# Patient Record
Sex: Male | Born: 1940 | Race: White | Hispanic: No | Marital: Married | Smoking: Former smoker
Health system: Southern US, Community
[De-identification: ages and names within clinical notes are randomized; demographics above are authoritative.]

## PROBLEM LIST (undated history)

## (undated) DIAGNOSIS — I10 Essential (primary) hypertension: Secondary | ICD-10-CM

## (undated) HISTORY — PX: COLONOSCOPY: SHX174

## (undated) HISTORY — PX: TOTAL HIP ARTHROPLASTY: SHX124

## (undated) HISTORY — DX: Essential (primary) hypertension: I10

---

## 2006-01-12 ENCOUNTER — Ambulatory Visit (HOSPITAL_COMMUNITY): Admission: RE | Admit: 2006-01-12 | Discharge: 2006-01-12 | Payer: Self-pay | Admitting: General Surgery

## 2007-05-09 ENCOUNTER — Encounter: Payer: Self-pay | Admitting: Orthopedic Surgery

## 2007-10-27 ENCOUNTER — Ambulatory Visit: Payer: Self-pay | Admitting: Orthopedic Surgery

## 2007-10-27 DIAGNOSIS — M549 Dorsalgia, unspecified: Secondary | ICD-10-CM | POA: Insufficient documentation

## 2007-10-27 DIAGNOSIS — M25559 Pain in unspecified hip: Secondary | ICD-10-CM | POA: Insufficient documentation

## 2007-10-27 DIAGNOSIS — M8700 Idiopathic aseptic necrosis of unspecified bone: Secondary | ICD-10-CM | POA: Insufficient documentation

## 2007-10-27 DIAGNOSIS — IMO0002 Reserved for concepts with insufficient information to code with codable children: Secondary | ICD-10-CM

## 2007-11-02 ENCOUNTER — Ambulatory Visit (HOSPITAL_COMMUNITY): Admission: RE | Admit: 2007-11-02 | Discharge: 2007-11-02 | Payer: Self-pay | Admitting: Orthopedic Surgery

## 2007-11-28 ENCOUNTER — Ambulatory Visit (HOSPITAL_COMMUNITY): Admission: RE | Admit: 2007-11-28 | Discharge: 2007-11-28 | Payer: Self-pay | Admitting: General Surgery

## 2009-02-23 IMAGING — CT CT PELVIS W/O CM
1 of 9 series · 3 of 14 positions shown, 4 images · IV contrast (agent unspecified)
Comparison: None.

CLINICAL DATA: 66-year-old male, with chronic low back pain radiating to both hips.  Evaluate herniated disc or avascular necrosis.  
 PELVIS CT WITHOUT CONTRAST:
TECHNIQUE: Multidetector CT imaging of the pelvis was performed following the standard protocol without IV contrast.
TECHNIQUE: Multidetector CT imaging of the lumbar spine was performed.  Multiplanar   CT image reconstructions were also generated.

[Series 3: lumbar spine 3.0 b30s · axial · 0.58mm/px · z∈[-472,-55]mm · 3 of 140 slices shown, 4 images]
[im 1/140  soft-tissue]
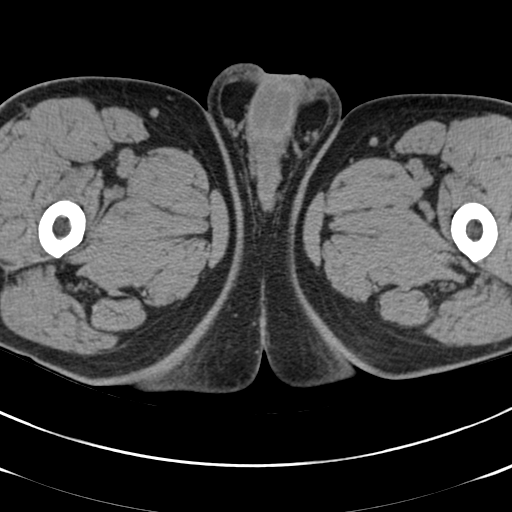
[im 1/140  bone]
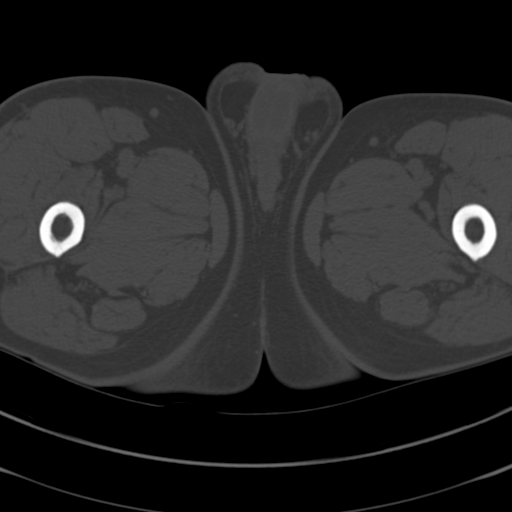
[im 70/140  bone]
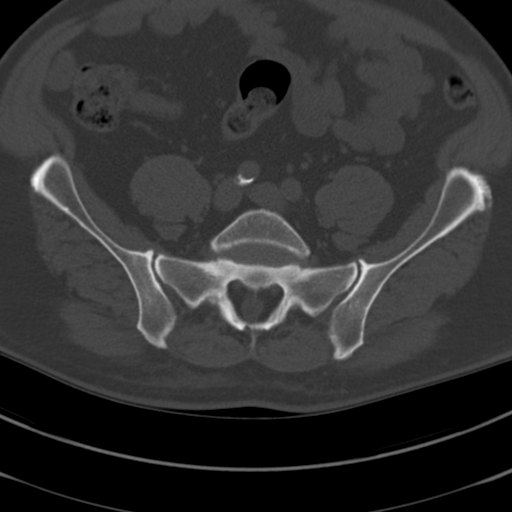
[im 140/140  bone]
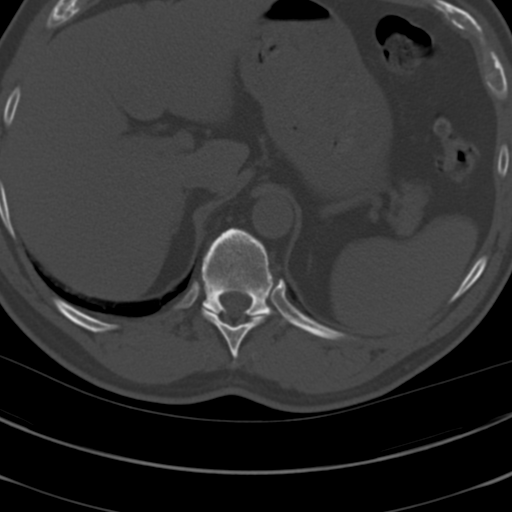

[3 of 14 positions shown; findings below may reference images not displayed]

FINDINGS: Calcified atherosclerotic disease involves the visualized distal abdominal aorta and continues into some segments of the iliac and bilateral femoral arteries bilaterally.  There are bilateral inguinal hernias, the left only contains an abundance of fat, the right contains a small segment of the bladder along with fat.  There is mild prostate enlargement and prostatic calcifications.  No free fluid in the pelvis.  Visualized distal large and small bowel loops are nondilated.  The appendix is normal.  There is diffuse sigmoid diverticulosis without evidence of active inflammation.  
 There is very severe bilateral degenerative arthropathy involving both hips, left perhaps slightly greater than right.  These show a complete loss of the superior joint spaces bilaterally with severe osteophytosis, subchondral sclerosis, and subchondral cystic formation.  The appearance is most compatible with very advanced osteoarthritis.  No acute fracture or dislocation is identified.  Elsewhere bone mineralization is within normal limits.  Mild subchondral cystic formation is also seen at the inferior aspects of the sacroiliac joints which also demonstrate some mild spurring.  Lumbar spine findings are discussed in the lumbar CT section below.
IMPRESSION: 1.  Very advanced bilateral hip joint osteoarthritis.  
 2.  Mild degenerative bilateral sacroiliac joint changes.
 3.  Sigmoid diverticulosis.  
 4.  Large predominately fat containing bilateral inguinal hernias.  A portion of the bladder has also herniated on the right. 
 LUMBAR SPINE CT WITHOUT CONTRAST:
FINDINGS: There is trace anterolisthesis of L-3 on L-4.  There is normal lumbar vertebral body height and bone mineralization.  Lumbar vertebral body alignment is otherwise normal.  Multilevel degenerative osseous changes are noted as follows:
 T11-T12:  Mild facet hypertrophy, no stenosis. 
 T12-L1:  Anterior disc bulge which will not cause neural compromise, otherwise normal. 
 L1-L2:  Mild to moderate facet spurring.  Slight disc bulge, no stenosis. 
 L2-L3:  Moderate bilateral facet spurring.  Bilateral ligament flavum hypertrophy.  Circumferential disc bulge.  Combined there is mild to moderate central spinal stenosis suspected (AP thecal sac approximately 6.6 mm).  Mild multifactorial bilateral neural foraminal stenosis. 
 L3-L4:  Very severe facet degeneration with spurring, hypertrophy, and bilateral vacuum facet phenomena, likely associated with the mild spondylolisthesis at this level.  Moderate to severe ligamentum flavum hypertrophy.  Moderate to severe left eccentric disc bulge.  Combined there is severe central spinal stenosis (AP thecal sac 4.9 mm).  There is moderate bilateral neural foraminal stenosis, multifactorial. 
 L4-L5:  Mild to moderate bilateral facet hypertrophy.  Mild circumferential disc bulge.  Mild central spinal stenosis suspected (AP thecal sac approximately 7.7 mm).  Mild multifactorial bilateral neural foraminal stenosis. 
 L5-S1:  No central spinal stenosis.  Moderate bilateral facet degeneration with spurring and subchondral cyst formation.  Mild bilateral multifactorial neural foraminal stenosis.
IMPRESSION: 1.  Severe spinal stenosis at L3-L4 related to advanced disc, facet, and ligamentum flavum degeneration.  Facet arthropathy at this level is particularly severe and there is trace anterolisthesis noted. 
 2.  Mild to moderate spinal stenosis at L2-L3 and mild spinal stenosis at L4-L5 as detailed above.
 3.  Multilevel multifactorial neural foraminal stenosis as detailed above.

## 2010-10-19 ENCOUNTER — Encounter: Payer: Self-pay | Admitting: Orthopedic Surgery

## 2011-02-10 NOTE — H&P (Signed)
NAME:  ROLLA, KEDZIERSKI NO.:  [id number]   MEDICAL RECORD NO.:  [id number]          PATIENT TYPE:  AMB   LOCATION:  DAY                           FACILITY:  [facility]   PHYSICIAN:  Dalia Heading, M.D.  DATE OF BIRTH:  December 01, 1940   DATE OF ADMISSION:  11/28/2007  DATE OF DISCHARGE:  LH                              HISTORY & PHYSICAL   CHIEF COMPLAINT:  Right inguinal hernia.   HISTORY OF PRESENT ILLNESS:  The patient is a 70 year old, white male  who is referred for evaluation and treatment of bilateral inguinal  hernias.  He has been seen by myself in the past and is becoming more  symptomatic on the right side.  It is made worse for straining.   PAST MEDICAL HISTORY:  Unremarkable.   PAST SURGICAL HISTORY:  Unremarkable.   CURRENT MEDICATIONS:  None.   ALLERGIES:  No known drug allergies.   REVIEW OF SYSTEMS:  Noncontributory.   PHYSICAL EXAMINATION:  GENERAL:  The patient is a well-developed, well-  nourished, white male in no acute distress.  LUNGS:  Clear to auscultation with equal breath sounds bilaterally.  HEART:  Regular rate and rhythm without S3, S4 or murmurs.  ABDOMEN:  Soft, nontender, nondistended.  No hepatosplenomegaly or  masses are noted.  A large reducible right inguinal hernia is present  with a smaller inguinal hernia on the left side.  GENITALIA:  Within normal limits.   IMPRESSION:  Symptomatic right inguinal hernia.   PLAN:  The patient is scheduled for a right inguinal herniorrhaphy on  November 28, 2007.  The risks and benefits of the procedure including  bleeding, infection, pain and recurrence of the hernia were fully  explained to the patient gaining informed consent.  I will delay repair  of the left inguinal hernia for the future.      Dalia Heading, M.D.  Electronically Signed     MAJ/MEDQ  D:  11/17/2007  T:  11/18/2007  Job:  [id number]   cc:   [facility]  Fax: [phone]   Kirstie Peri, MD  Fax: [phone]

## 2011-02-10 NOTE — H&P (Signed)
NAME:  Wesley Palmer, Wesley Palmer NO.:  [id number]   MEDICAL RECORD NO.:  [id number]          PATIENT TYPE:  AMB   LOCATION:  DAY                           FACILITY:  [facility]   PHYSICIAN:  Dalia Heading, M.D.  DATE OF BIRTH:  [birth year]   DATE OF ADMISSION:  11/28/2007  DATE OF DISCHARGE:  LH                              HISTORY & PHYSICAL   CHIEF COMPLAINT:  Right inguinal hernia.   HISTORY OF PRESENT ILLNESS:  The patient is a 70 year old, white male  who is referred for evaluation and treatment of bilateral inguinal  hernias.  He has been seen by myself in the past and is becoming more  symptomatic on the right side.  It is made worse for straining.   PAST MEDICAL HISTORY:  Unremarkable.   PAST SURGICAL HISTORY:  Unremarkable.   CURRENT MEDICATIONS:  None.   ALLERGIES:  No known drug allergies.   REVIEW OF SYSTEMS:  Noncontributory.   PHYSICAL EXAMINATION:  GENERAL:  The patient is a well-developed, well-  nourished, white male in no acute distress.  LUNGS:  Clear to auscultation with equal breath sounds bilaterally.  HEART:  Regular rate and rhythm without S3, S4 or murmurs.  ABDOMEN:  Soft, nontender, nondistended.  No hepatosplenomegaly or  masses are noted.  A large reducible right inguinal hernia is present  with a smaller inguinal hernia on the left side.  GENITALIA:  Within normal limits.   IMPRESSION:  Symptomatic right inguinal hernia.   PLAN:  The patient is scheduled for a right inguinal herniorrhaphy on  November 28, 2007.  The risks and benefits of the procedure including  bleeding, infection, pain and recurrence of the hernia were fully  explained to the patient gaining informed consent.  I will delay repair  of the left inguinal hernia for the future.      Dalia Heading, M.D.  Electronically Signed     MAJ/MEDQ  D:  11/17/2007  T:  11/18/2007  Job:  [id number]   cc:   [facility]  Fax: [phone]   Kirstie Peri, MD  Fax: [phone]

## 2011-02-10 NOTE — Op Note (Signed)
NAME:  Wesley Palmer, Wesley Palmer NO.:  [id number]   MEDICAL RECORD NO.:  [id number]          PATIENT TYPE:  AMB   LOCATION:  DAY                           FACILITY:  [facility]   PHYSICIAN:  Dalia Heading, M.D.  DATE OF BIRTH:  March 21, 1941   DATE OF PROCEDURE:  11/28/2007  DATE OF DISCHARGE:                               OPERATIVE REPORT   PREOPERATIVE DIAGNOSIS:  Right inguinal hernia.   POSTOPERATIVE DIAGNOSIS:  Right inguinal hernia, direct and indirect.   PROCEDURE:  Right inguinal herniorrhaphy.   SURGEON:  Dalia Heading, M.D.   ANESTHESIA:  Spinal.   INDICATIONS:  The patient is a 70 year old white male who presents with  a symptomatic right inguinal hernia.  The risks and benefits of the  procedure including bleeding, infection, pain, and recurrence of the  hernia, were fully explained to the patient, who gave informed consent.   PROCEDURE NOTE:  The patient was placed in the supine position after  spinal anesthesia was administered.  The right groin region was prepped  and draped using the usual sterile technique with Betadine.  Surgical  site confirmation was performed.   A transverse incision was made in the right groin region down to the  external oblique aponeurosis.  The aponeurosis was incised to the  external ring.  A Penrose drain was placed around the spermatic cord.  The vas deferens was noted within the spermatic cord.  The patient was  noted to have a large direct hernia as well as a smaller indirect  hernia.  He also had a large lipoma of the cord, which was excised.  The  direct hernia was incised at its base and inverted.  A large-sized  polypropylene mesh plug was then placed into this region and secured  circumferentially to the transversalis fascia using 2-0 Novofil  interrupted sutures.  A medium-sized polypropylene mesh plug was then  placed in the indirect inguinal region and secured to the transversalis  fascia using a 2-0 Novofil  interrupted suture.  An onlay polypropylene  mesh patch was then placed along the floor of the inguinal canal and  secured superiorly to the conjoined tendon and inferiorly to the  shelving edge of Poupart's ligament using 2-0 Novofil interrupted  sutures.  The internal ring was recreated using a 2-0 Novofil  interrupted suture.  The external oblique aponeurosis was reapproximated  using a 2-0 Vicryl running suture.  Subcutaneous layer was  reapproximated using a 3-0 Vicryl interrupted suture.  The skin was  closed using a 4-0 Vicryl subcuticular suture.  Sensorcaine 0.5% was  instilled into the surrounding wound.  Dermabond was then applied.   All tape and needle counts were correct at the end of the procedure.  The patient was transferred to PACU in stable condition.   COMPLICATIONS:  None.   SPECIMEN:  None.   BLOOD LOSS:  Minimal.      Dalia Heading, M.D.  Electronically Signed     MAJ/MEDQ  D:  11/28/2007  T:  11/28/2007  Job:  [id number]   cc:   Kirstie Peri, MD  Fax:  [phone] 

## 2011-02-13 NOTE — H&P (Signed)
NAME:  Wesley Palmer, FAUL NO.:  [id number]   MEDICAL RECORD NO.:  [id number]          PATIENT TYPE:  AMB   LOCATION:                                FACILITY:  [facility]   PHYSICIAN:  Dalia Heading, M.D.  DATE OF BIRTH:  Jan 27, 1941   DATE OF ADMISSION:  DATE OF DISCHARGE:  LH                                HISTORY & PHYSICAL   CHIEF COMPLAINT:  Hematochezia.   HISTORY OF PRESENT ILLNESS:  Patient is a 70 year old white male who was  referred for endoscopic evaluation.  Needs a colonoscopy for hematochezia.  It has been present for many months.  No abdominal pain, weight loss,  nausea, vomiting, diarrhea, constipation, or melena have been noted.  He has  never had a colonoscopy.  There is no family history of colon carcinoma.   PAST MEDICAL HISTORY:  Unremarkable.   PAST SURGICAL HISTORY:  Unremarkable.   CURRENT MEDICATIONS:  None.   ALLERGIES:  No known drug allergies.   REVIEW OF SYSTEMS:  Noncontributory.   PHYSICAL EXAMINATION:  GENERAL:  The patient is a well-developed, well-  nourished white male in no acute distress.  LUNGS:  Clear to auscultation with equal breath sounds bilaterally.  HEART:  Regular rate and rhythm without S3, S4, or murmurs.  ABDOMEN:  Soft, nontender, nondistended.  Bilateral reducible inguinal  hernias are noted.  No hepatosplenomegaly or masses are noted.  RECTAL:  Deferred to the procedure.   IMPRESSION:  Hematochezia.   PLAN:  The patient is scheduled for a colonoscopy on January 12, 2006.  The  risks and benefits of the procedure including bleeding and perforation were  fully explained to the patient, gave informed consent.  We will address the  inguinal hernias at a later date as they have been present for many years.      Dalia Heading, M.D.  Electronically Signed     MAJ/MEDQ  D:  01/05/2006  T:  01/05/2006  Job:  [id number]   cc:   [facility]  Fax: [phone]

## 2011-06-19 LAB — BASIC METABOLIC PANEL
CO2: 28
Calcium: 9.5
Creatinine, Ser: 1.17
GFR calc Af Amer: >60

## 2011-06-19 LAB — CBC
MCHC: 34.8
RBC: 4.89

## 2018-10-06 DIAGNOSIS — W450XXS Nail entering through skin, sequela: Secondary | ICD-10-CM | POA: Diagnosis not present

## 2018-10-06 DIAGNOSIS — I10 Essential (primary) hypertension: Secondary | ICD-10-CM | POA: Diagnosis not present

## 2018-10-06 DIAGNOSIS — Z6833 Body mass index (BMI) 33.0-33.9, adult: Secondary | ICD-10-CM | POA: Diagnosis not present

## 2018-10-06 DIAGNOSIS — H109 Unspecified conjunctivitis: Secondary | ICD-10-CM | POA: Diagnosis not present

## 2018-10-06 DIAGNOSIS — K219 Gastro-esophageal reflux disease without esophagitis: Secondary | ICD-10-CM | POA: Diagnosis not present

## 2018-10-06 DIAGNOSIS — Z299 Encounter for prophylactic measures, unspecified: Secondary | ICD-10-CM | POA: Diagnosis not present

## 2018-10-06 DIAGNOSIS — H5440 Blindness, one eye, unspecified eye: Secondary | ICD-10-CM | POA: Diagnosis not present

## 2018-10-10 DIAGNOSIS — M47816 Spondylosis without myelopathy or radiculopathy, lumbar region: Secondary | ICD-10-CM | POA: Diagnosis not present

## 2018-10-10 DIAGNOSIS — M170 Bilateral primary osteoarthritis of knee: Secondary | ICD-10-CM | POA: Diagnosis not present

## 2018-10-10 DIAGNOSIS — Z5181 Encounter for therapeutic drug level monitoring: Secondary | ICD-10-CM | POA: Diagnosis not present

## 2018-10-10 DIAGNOSIS — M5136 Other intervertebral disc degeneration, lumbar region: Secondary | ICD-10-CM | POA: Diagnosis not present

## 2018-10-10 DIAGNOSIS — Z79891 Long term (current) use of opiate analgesic: Secondary | ICD-10-CM | POA: Diagnosis not present

## 2018-10-10 DIAGNOSIS — G894 Chronic pain syndrome: Secondary | ICD-10-CM | POA: Diagnosis not present

## 2018-11-16 DIAGNOSIS — I10 Essential (primary) hypertension: Secondary | ICD-10-CM | POA: Diagnosis not present

## 2018-11-16 DIAGNOSIS — Z6833 Body mass index (BMI) 33.0-33.9, adult: Secondary | ICD-10-CM | POA: Diagnosis not present

## 2018-11-16 DIAGNOSIS — Z299 Encounter for prophylactic measures, unspecified: Secondary | ICD-10-CM | POA: Diagnosis not present

## 2018-11-16 DIAGNOSIS — Z789 Other specified health status: Secondary | ICD-10-CM | POA: Diagnosis not present

## 2018-11-16 DIAGNOSIS — J329 Chronic sinusitis, unspecified: Secondary | ICD-10-CM | POA: Diagnosis not present

## 2018-11-16 DIAGNOSIS — K4020 Bilateral inguinal hernia, without obstruction or gangrene, not specified as recurrent: Secondary | ICD-10-CM | POA: Diagnosis not present

## 2018-11-16 DIAGNOSIS — H9209 Otalgia, unspecified ear: Secondary | ICD-10-CM | POA: Diagnosis not present

## 2018-12-16 DIAGNOSIS — Z1339 Encounter for screening examination for other mental health and behavioral disorders: Secondary | ICD-10-CM | POA: Diagnosis not present

## 2018-12-16 DIAGNOSIS — Z299 Encounter for prophylactic measures, unspecified: Secondary | ICD-10-CM | POA: Diagnosis not present

## 2018-12-16 DIAGNOSIS — Z6833 Body mass index (BMI) 33.0-33.9, adult: Secondary | ICD-10-CM | POA: Diagnosis not present

## 2018-12-16 DIAGNOSIS — Z0000 Encounter for general adult medical examination without abnormal findings: Secondary | ICD-10-CM | POA: Diagnosis not present

## 2018-12-16 DIAGNOSIS — I10 Essential (primary) hypertension: Secondary | ICD-10-CM | POA: Diagnosis not present

## 2018-12-16 DIAGNOSIS — Z1331 Encounter for screening for depression: Secondary | ICD-10-CM | POA: Diagnosis not present

## 2018-12-16 DIAGNOSIS — Z7189 Other specified counseling: Secondary | ICD-10-CM | POA: Diagnosis not present

## 2018-12-16 DIAGNOSIS — Z1211 Encounter for screening for malignant neoplasm of colon: Secondary | ICD-10-CM | POA: Diagnosis not present

## 2019-01-02 DIAGNOSIS — Z713 Dietary counseling and surveillance: Secondary | ICD-10-CM | POA: Diagnosis not present

## 2019-01-02 DIAGNOSIS — Z299 Encounter for prophylactic measures, unspecified: Secondary | ICD-10-CM | POA: Diagnosis not present

## 2019-01-02 DIAGNOSIS — I10 Essential (primary) hypertension: Secondary | ICD-10-CM | POA: Diagnosis not present

## 2019-01-02 DIAGNOSIS — Z6833 Body mass index (BMI) 33.0-33.9, adult: Secondary | ICD-10-CM | POA: Diagnosis not present

## 2019-01-02 DIAGNOSIS — W57XXXA Bitten or stung by nonvenomous insect and other nonvenomous arthropods, initial encounter: Secondary | ICD-10-CM | POA: Diagnosis not present

## 2019-01-09 DIAGNOSIS — Z79891 Long term (current) use of opiate analgesic: Secondary | ICD-10-CM | POA: Diagnosis not present

## 2019-01-09 DIAGNOSIS — M47816 Spondylosis without myelopathy or radiculopathy, lumbar region: Secondary | ICD-10-CM | POA: Diagnosis not present

## 2019-01-09 DIAGNOSIS — G894 Chronic pain syndrome: Secondary | ICD-10-CM | POA: Diagnosis not present

## 2019-01-09 DIAGNOSIS — Z5181 Encounter for therapeutic drug level monitoring: Secondary | ICD-10-CM | POA: Diagnosis not present

## 2019-01-09 DIAGNOSIS — M5136 Other intervertebral disc degeneration, lumbar region: Secondary | ICD-10-CM | POA: Diagnosis not present

## 2019-01-16 DIAGNOSIS — M5136 Other intervertebral disc degeneration, lumbar region: Secondary | ICD-10-CM | POA: Diagnosis not present

## 2019-01-16 DIAGNOSIS — R52 Pain, unspecified: Secondary | ICD-10-CM | POA: Diagnosis not present

## 2019-01-16 DIAGNOSIS — M47816 Spondylosis without myelopathy or radiculopathy, lumbar region: Secondary | ICD-10-CM | POA: Diagnosis not present

## 2019-01-25 DIAGNOSIS — L570 Actinic keratosis: Secondary | ICD-10-CM | POA: Diagnosis not present

## 2019-02-17 DIAGNOSIS — I10 Essential (primary) hypertension: Secondary | ICD-10-CM | POA: Diagnosis not present

## 2019-02-17 DIAGNOSIS — J189 Pneumonia, unspecified organism: Secondary | ICD-10-CM | POA: Diagnosis not present

## 2019-02-17 DIAGNOSIS — Z6833 Body mass index (BMI) 33.0-33.9, adult: Secondary | ICD-10-CM | POA: Diagnosis not present

## 2019-02-17 DIAGNOSIS — Z87891 Personal history of nicotine dependence: Secondary | ICD-10-CM | POA: Diagnosis not present

## 2019-02-17 DIAGNOSIS — Z299 Encounter for prophylactic measures, unspecified: Secondary | ICD-10-CM | POA: Diagnosis not present

## 2019-02-21 DIAGNOSIS — G8929 Other chronic pain: Secondary | ICD-10-CM | POA: Diagnosis not present

## 2019-02-21 DIAGNOSIS — M47816 Spondylosis without myelopathy or radiculopathy, lumbar region: Secondary | ICD-10-CM | POA: Diagnosis not present

## 2019-02-21 DIAGNOSIS — M545 Low back pain: Secondary | ICD-10-CM | POA: Diagnosis not present

## 2019-02-21 DIAGNOSIS — M7918 Myalgia, other site: Secondary | ICD-10-CM | POA: Diagnosis not present

## 2019-02-21 DIAGNOSIS — G894 Chronic pain syndrome: Secondary | ICD-10-CM | POA: Diagnosis not present

## 2019-02-21 DIAGNOSIS — Z79891 Long term (current) use of opiate analgesic: Secondary | ICD-10-CM | POA: Diagnosis not present

## 2019-02-21 DIAGNOSIS — M5136 Other intervertebral disc degeneration, lumbar region: Secondary | ICD-10-CM | POA: Diagnosis not present

## 2019-03-20 DIAGNOSIS — Z6833 Body mass index (BMI) 33.0-33.9, adult: Secondary | ICD-10-CM | POA: Diagnosis not present

## 2019-03-20 DIAGNOSIS — S60559A Superficial foreign body of unspecified hand, initial encounter: Secondary | ICD-10-CM | POA: Diagnosis not present

## 2019-03-20 DIAGNOSIS — I10 Essential (primary) hypertension: Secondary | ICD-10-CM | POA: Diagnosis not present

## 2019-03-20 DIAGNOSIS — Z713 Dietary counseling and surveillance: Secondary | ICD-10-CM | POA: Diagnosis not present

## 2019-03-20 DIAGNOSIS — Z299 Encounter for prophylactic measures, unspecified: Secondary | ICD-10-CM | POA: Diagnosis not present

## 2019-03-27 DIAGNOSIS — Z125 Encounter for screening for malignant neoplasm of prostate: Secondary | ICD-10-CM | POA: Diagnosis not present

## 2019-03-27 DIAGNOSIS — R5383 Other fatigue: Secondary | ICD-10-CM | POA: Diagnosis not present

## 2019-03-27 DIAGNOSIS — Z79899 Other long term (current) drug therapy: Secondary | ICD-10-CM | POA: Diagnosis not present

## 2019-04-13 DIAGNOSIS — K219 Gastro-esophageal reflux disease without esophagitis: Secondary | ICD-10-CM | POA: Diagnosis not present

## 2019-04-13 DIAGNOSIS — Z299 Encounter for prophylactic measures, unspecified: Secondary | ICD-10-CM | POA: Diagnosis not present

## 2019-04-13 DIAGNOSIS — Z6833 Body mass index (BMI) 33.0-33.9, adult: Secondary | ICD-10-CM | POA: Diagnosis not present

## 2019-04-13 DIAGNOSIS — J069 Acute upper respiratory infection, unspecified: Secondary | ICD-10-CM | POA: Diagnosis not present

## 2019-04-13 DIAGNOSIS — I10 Essential (primary) hypertension: Secondary | ICD-10-CM | POA: Diagnosis not present

## 2019-04-14 DIAGNOSIS — Z20828 Contact with and (suspected) exposure to other viral communicable diseases: Secondary | ICD-10-CM | POA: Diagnosis not present

## 2019-04-27 DIAGNOSIS — Z6833 Body mass index (BMI) 33.0-33.9, adult: Secondary | ICD-10-CM | POA: Diagnosis not present

## 2019-04-27 DIAGNOSIS — Z299 Encounter for prophylactic measures, unspecified: Secondary | ICD-10-CM | POA: Diagnosis not present

## 2019-04-27 DIAGNOSIS — R0600 Dyspnea, unspecified: Secondary | ICD-10-CM | POA: Diagnosis not present

## 2019-04-27 DIAGNOSIS — Z87891 Personal history of nicotine dependence: Secondary | ICD-10-CM | POA: Diagnosis not present

## 2019-04-27 DIAGNOSIS — I10 Essential (primary) hypertension: Secondary | ICD-10-CM | POA: Diagnosis not present

## 2019-05-05 DIAGNOSIS — I10 Essential (primary) hypertension: Secondary | ICD-10-CM | POA: Diagnosis not present

## 2019-05-05 DIAGNOSIS — Z299 Encounter for prophylactic measures, unspecified: Secondary | ICD-10-CM | POA: Diagnosis not present

## 2019-05-05 DIAGNOSIS — M5430 Sciatica, unspecified side: Secondary | ICD-10-CM | POA: Diagnosis not present

## 2019-05-05 DIAGNOSIS — Z6833 Body mass index (BMI) 33.0-33.9, adult: Secondary | ICD-10-CM | POA: Diagnosis not present

## 2019-05-05 DIAGNOSIS — F17210 Nicotine dependence, cigarettes, uncomplicated: Secondary | ICD-10-CM | POA: Diagnosis not present

## 2019-05-05 DIAGNOSIS — J069 Acute upper respiratory infection, unspecified: Secondary | ICD-10-CM | POA: Diagnosis not present

## 2019-05-11 DIAGNOSIS — M170 Bilateral primary osteoarthritis of knee: Secondary | ICD-10-CM | POA: Diagnosis not present

## 2019-05-11 DIAGNOSIS — M47816 Spondylosis without myelopathy or radiculopathy, lumbar region: Secondary | ICD-10-CM | POA: Diagnosis not present

## 2019-05-11 DIAGNOSIS — G894 Chronic pain syndrome: Secondary | ICD-10-CM | POA: Diagnosis not present

## 2019-05-11 DIAGNOSIS — Z79891 Long term (current) use of opiate analgesic: Secondary | ICD-10-CM | POA: Diagnosis not present

## 2019-05-11 DIAGNOSIS — Z5181 Encounter for therapeutic drug level monitoring: Secondary | ICD-10-CM | POA: Diagnosis not present

## 2019-05-11 DIAGNOSIS — M5136 Other intervertebral disc degeneration, lumbar region: Secondary | ICD-10-CM | POA: Diagnosis not present

## 2019-05-30 DIAGNOSIS — J309 Allergic rhinitis, unspecified: Secondary | ICD-10-CM | POA: Diagnosis not present

## 2019-05-30 DIAGNOSIS — Z6833 Body mass index (BMI) 33.0-33.9, adult: Secondary | ICD-10-CM | POA: Diagnosis not present

## 2019-05-30 DIAGNOSIS — J069 Acute upper respiratory infection, unspecified: Secondary | ICD-10-CM | POA: Diagnosis not present

## 2019-05-30 DIAGNOSIS — Z299 Encounter for prophylactic measures, unspecified: Secondary | ICD-10-CM | POA: Diagnosis not present

## 2019-05-30 DIAGNOSIS — I10 Essential (primary) hypertension: Secondary | ICD-10-CM | POA: Diagnosis not present

## 2019-05-30 DIAGNOSIS — F17210 Nicotine dependence, cigarettes, uncomplicated: Secondary | ICD-10-CM | POA: Diagnosis not present

## 2019-05-30 DIAGNOSIS — R0602 Shortness of breath: Secondary | ICD-10-CM | POA: Diagnosis not present

## 2019-06-21 DIAGNOSIS — Z713 Dietary counseling and surveillance: Secondary | ICD-10-CM | POA: Diagnosis not present

## 2019-06-21 DIAGNOSIS — Z299 Encounter for prophylactic measures, unspecified: Secondary | ICD-10-CM | POA: Diagnosis not present

## 2019-06-21 DIAGNOSIS — I10 Essential (primary) hypertension: Secondary | ICD-10-CM | POA: Diagnosis not present

## 2019-06-21 DIAGNOSIS — Z6834 Body mass index (BMI) 34.0-34.9, adult: Secondary | ICD-10-CM | POA: Diagnosis not present

## 2019-07-14 DIAGNOSIS — I10 Essential (primary) hypertension: Secondary | ICD-10-CM | POA: Diagnosis not present

## 2019-07-14 DIAGNOSIS — Z299 Encounter for prophylactic measures, unspecified: Secondary | ICD-10-CM | POA: Diagnosis not present

## 2019-07-14 DIAGNOSIS — Z713 Dietary counseling and surveillance: Secondary | ICD-10-CM | POA: Diagnosis not present

## 2019-07-14 DIAGNOSIS — M47816 Spondylosis without myelopathy or radiculopathy, lumbar region: Secondary | ICD-10-CM | POA: Diagnosis not present

## 2019-07-14 DIAGNOSIS — Z6834 Body mass index (BMI) 34.0-34.9, adult: Secondary | ICD-10-CM | POA: Diagnosis not present

## 2019-07-26 DIAGNOSIS — C44729 Squamous cell carcinoma of skin of left lower limb, including hip: Secondary | ICD-10-CM | POA: Diagnosis not present

## 2019-07-26 DIAGNOSIS — L570 Actinic keratosis: Secondary | ICD-10-CM | POA: Diagnosis not present

## 2019-08-11 DIAGNOSIS — Z5181 Encounter for therapeutic drug level monitoring: Secondary | ICD-10-CM | POA: Diagnosis not present

## 2019-08-11 DIAGNOSIS — G894 Chronic pain syndrome: Secondary | ICD-10-CM | POA: Diagnosis not present

## 2019-08-11 DIAGNOSIS — M170 Bilateral primary osteoarthritis of knee: Secondary | ICD-10-CM | POA: Diagnosis not present

## 2019-08-11 DIAGNOSIS — Z79891 Long term (current) use of opiate analgesic: Secondary | ICD-10-CM | POA: Diagnosis not present

## 2019-08-11 DIAGNOSIS — M5136 Other intervertebral disc degeneration, lumbar region: Secondary | ICD-10-CM | POA: Diagnosis not present

## 2019-08-11 DIAGNOSIS — M47816 Spondylosis without myelopathy or radiculopathy, lumbar region: Secondary | ICD-10-CM | POA: Diagnosis not present

## 2019-09-20 DIAGNOSIS — J069 Acute upper respiratory infection, unspecified: Secondary | ICD-10-CM | POA: Diagnosis not present

## 2019-09-20 DIAGNOSIS — Z299 Encounter for prophylactic measures, unspecified: Secondary | ICD-10-CM | POA: Diagnosis not present

## 2019-09-20 DIAGNOSIS — Z6834 Body mass index (BMI) 34.0-34.9, adult: Secondary | ICD-10-CM | POA: Diagnosis not present

## 2019-09-20 DIAGNOSIS — I10 Essential (primary) hypertension: Secondary | ICD-10-CM | POA: Diagnosis not present

## 2019-09-20 DIAGNOSIS — B309 Viral conjunctivitis, unspecified: Secondary | ICD-10-CM | POA: Diagnosis not present

## 2019-10-09 DIAGNOSIS — K921 Melena: Secondary | ICD-10-CM | POA: Diagnosis not present

## 2019-10-09 DIAGNOSIS — I10 Essential (primary) hypertension: Secondary | ICD-10-CM | POA: Diagnosis not present

## 2019-10-09 DIAGNOSIS — Z299 Encounter for prophylactic measures, unspecified: Secondary | ICD-10-CM | POA: Diagnosis not present

## 2019-10-09 DIAGNOSIS — Z6834 Body mass index (BMI) 34.0-34.9, adult: Secondary | ICD-10-CM | POA: Diagnosis not present

## 2019-10-09 DIAGNOSIS — Z789 Other specified health status: Secondary | ICD-10-CM | POA: Diagnosis not present

## 2019-10-17 DIAGNOSIS — Z789 Other specified health status: Secondary | ICD-10-CM | POA: Diagnosis not present

## 2019-10-17 DIAGNOSIS — I10 Essential (primary) hypertension: Secondary | ICD-10-CM | POA: Diagnosis not present

## 2019-10-17 DIAGNOSIS — Z6834 Body mass index (BMI) 34.0-34.9, adult: Secondary | ICD-10-CM | POA: Diagnosis not present

## 2019-10-17 DIAGNOSIS — Z299 Encounter for prophylactic measures, unspecified: Secondary | ICD-10-CM | POA: Diagnosis not present

## 2019-10-17 DIAGNOSIS — R0602 Shortness of breath: Secondary | ICD-10-CM | POA: Diagnosis not present

## 2019-10-30 DIAGNOSIS — R0602 Shortness of breath: Secondary | ICD-10-CM | POA: Diagnosis not present

## 2019-11-06 DIAGNOSIS — Z5181 Encounter for therapeutic drug level monitoring: Secondary | ICD-10-CM | POA: Diagnosis not present

## 2019-11-06 DIAGNOSIS — M170 Bilateral primary osteoarthritis of knee: Secondary | ICD-10-CM | POA: Diagnosis not present

## 2019-11-06 DIAGNOSIS — M47816 Spondylosis without myelopathy or radiculopathy, lumbar region: Secondary | ICD-10-CM | POA: Diagnosis not present

## 2019-11-06 DIAGNOSIS — G894 Chronic pain syndrome: Secondary | ICD-10-CM | POA: Diagnosis not present

## 2019-11-06 DIAGNOSIS — Z79891 Long term (current) use of opiate analgesic: Secondary | ICD-10-CM | POA: Diagnosis not present

## 2019-11-06 DIAGNOSIS — M5136 Other intervertebral disc degeneration, lumbar region: Secondary | ICD-10-CM | POA: Diagnosis not present

## 2019-12-05 DIAGNOSIS — K429 Umbilical hernia without obstruction or gangrene: Secondary | ICD-10-CM | POA: Diagnosis not present

## 2019-12-05 DIAGNOSIS — K4090 Unilateral inguinal hernia, without obstruction or gangrene, not specified as recurrent: Secondary | ICD-10-CM | POA: Diagnosis not present

## 2019-12-05 DIAGNOSIS — K625 Hemorrhage of anus and rectum: Secondary | ICD-10-CM | POA: Diagnosis not present

## 2019-12-08 DIAGNOSIS — R05 Cough: Secondary | ICD-10-CM | POA: Diagnosis not present

## 2019-12-08 DIAGNOSIS — Z299 Encounter for prophylactic measures, unspecified: Secondary | ICD-10-CM | POA: Diagnosis not present

## 2019-12-08 DIAGNOSIS — M1990 Unspecified osteoarthritis, unspecified site: Secondary | ICD-10-CM | POA: Diagnosis not present

## 2019-12-08 DIAGNOSIS — Z789 Other specified health status: Secondary | ICD-10-CM | POA: Diagnosis not present

## 2019-12-08 DIAGNOSIS — I10 Essential (primary) hypertension: Secondary | ICD-10-CM | POA: Diagnosis not present

## 2019-12-08 DIAGNOSIS — Z6834 Body mass index (BMI) 34.0-34.9, adult: Secondary | ICD-10-CM | POA: Diagnosis not present

## 2019-12-15 DIAGNOSIS — Z01818 Encounter for other preprocedural examination: Secondary | ICD-10-CM | POA: Diagnosis not present

## 2019-12-18 DIAGNOSIS — D122 Benign neoplasm of ascending colon: Secondary | ICD-10-CM | POA: Diagnosis not present

## 2019-12-18 DIAGNOSIS — D128 Benign neoplasm of rectum: Secondary | ICD-10-CM | POA: Diagnosis not present

## 2019-12-18 DIAGNOSIS — K5730 Diverticulosis of large intestine without perforation or abscess without bleeding: Secondary | ICD-10-CM | POA: Diagnosis not present

## 2019-12-18 DIAGNOSIS — K625 Hemorrhage of anus and rectum: Secondary | ICD-10-CM | POA: Diagnosis not present

## 2019-12-18 DIAGNOSIS — K429 Umbilical hernia without obstruction or gangrene: Secondary | ICD-10-CM | POA: Diagnosis not present

## 2019-12-18 DIAGNOSIS — Z96643 Presence of artificial hip joint, bilateral: Secondary | ICD-10-CM | POA: Diagnosis not present

## 2019-12-18 DIAGNOSIS — K644 Residual hemorrhoidal skin tags: Secondary | ICD-10-CM | POA: Diagnosis not present

## 2019-12-18 DIAGNOSIS — I10 Essential (primary) hypertension: Secondary | ICD-10-CM | POA: Diagnosis not present

## 2019-12-18 DIAGNOSIS — K621 Rectal polyp: Secondary | ICD-10-CM | POA: Diagnosis not present

## 2019-12-18 DIAGNOSIS — K4090 Unilateral inguinal hernia, without obstruction or gangrene, not specified as recurrent: Secondary | ICD-10-CM | POA: Diagnosis not present

## 2019-12-19 DIAGNOSIS — Z1339 Encounter for screening examination for other mental health and behavioral disorders: Secondary | ICD-10-CM | POA: Diagnosis not present

## 2019-12-19 DIAGNOSIS — Z79899 Other long term (current) drug therapy: Secondary | ICD-10-CM | POA: Diagnosis not present

## 2019-12-19 DIAGNOSIS — Z125 Encounter for screening for malignant neoplasm of prostate: Secondary | ICD-10-CM | POA: Diagnosis not present

## 2019-12-19 DIAGNOSIS — Z6834 Body mass index (BMI) 34.0-34.9, adult: Secondary | ICD-10-CM | POA: Diagnosis not present

## 2019-12-19 DIAGNOSIS — I10 Essential (primary) hypertension: Secondary | ICD-10-CM | POA: Diagnosis not present

## 2019-12-19 DIAGNOSIS — R5383 Other fatigue: Secondary | ICD-10-CM | POA: Diagnosis not present

## 2019-12-19 DIAGNOSIS — Z1331 Encounter for screening for depression: Secondary | ICD-10-CM | POA: Diagnosis not present

## 2019-12-19 DIAGNOSIS — R0602 Shortness of breath: Secondary | ICD-10-CM | POA: Diagnosis not present

## 2019-12-19 DIAGNOSIS — Z7189 Other specified counseling: Secondary | ICD-10-CM | POA: Diagnosis not present

## 2019-12-19 DIAGNOSIS — Z0000 Encounter for general adult medical examination without abnormal findings: Secondary | ICD-10-CM | POA: Diagnosis not present

## 2020-01-02 DIAGNOSIS — K429 Umbilical hernia without obstruction or gangrene: Secondary | ICD-10-CM | POA: Diagnosis not present

## 2020-01-02 DIAGNOSIS — K4090 Unilateral inguinal hernia, without obstruction or gangrene, not specified as recurrent: Secondary | ICD-10-CM | POA: Diagnosis not present

## 2020-01-02 DIAGNOSIS — D122 Benign neoplasm of ascending colon: Secondary | ICD-10-CM | POA: Diagnosis not present

## 2020-01-18 DIAGNOSIS — H1789 Other corneal scars and opacities: Secondary | ICD-10-CM | POA: Diagnosis not present

## 2020-01-18 DIAGNOSIS — Z0100 Encounter for examination of eyes and vision without abnormal findings: Secondary | ICD-10-CM | POA: Diagnosis not present

## 2020-02-06 DIAGNOSIS — M25559 Pain in unspecified hip: Secondary | ICD-10-CM | POA: Diagnosis not present

## 2020-02-06 DIAGNOSIS — I10 Essential (primary) hypertension: Secondary | ICD-10-CM | POA: Diagnosis not present

## 2020-02-06 DIAGNOSIS — R5383 Other fatigue: Secondary | ICD-10-CM | POA: Diagnosis not present

## 2020-02-06 DIAGNOSIS — Z299 Encounter for prophylactic measures, unspecified: Secondary | ICD-10-CM | POA: Diagnosis not present

## 2020-02-12 DIAGNOSIS — M5136 Other intervertebral disc degeneration, lumbar region: Secondary | ICD-10-CM | POA: Diagnosis not present

## 2020-02-12 DIAGNOSIS — M7918 Myalgia, other site: Secondary | ICD-10-CM | POA: Diagnosis not present

## 2020-02-12 DIAGNOSIS — M170 Bilateral primary osteoarthritis of knee: Secondary | ICD-10-CM | POA: Diagnosis not present

## 2020-02-12 DIAGNOSIS — M47816 Spondylosis without myelopathy or radiculopathy, lumbar region: Secondary | ICD-10-CM | POA: Diagnosis not present

## 2020-02-12 DIAGNOSIS — Z79891 Long term (current) use of opiate analgesic: Secondary | ICD-10-CM | POA: Diagnosis not present

## 2020-02-12 DIAGNOSIS — G8929 Other chronic pain: Secondary | ICD-10-CM | POA: Diagnosis not present

## 2020-02-12 DIAGNOSIS — Z5181 Encounter for therapeutic drug level monitoring: Secondary | ICD-10-CM | POA: Diagnosis not present

## 2020-02-12 DIAGNOSIS — M1611 Unilateral primary osteoarthritis, right hip: Secondary | ICD-10-CM | POA: Diagnosis not present

## 2020-02-19 DIAGNOSIS — R5383 Other fatigue: Secondary | ICD-10-CM | POA: Diagnosis not present

## 2020-03-13 DIAGNOSIS — Z299 Encounter for prophylactic measures, unspecified: Secondary | ICD-10-CM | POA: Diagnosis not present

## 2020-03-13 DIAGNOSIS — L089 Local infection of the skin and subcutaneous tissue, unspecified: Secondary | ICD-10-CM | POA: Diagnosis not present

## 2020-03-13 DIAGNOSIS — I10 Essential (primary) hypertension: Secondary | ICD-10-CM | POA: Diagnosis not present

## 2020-03-13 DIAGNOSIS — Z789 Other specified health status: Secondary | ICD-10-CM | POA: Diagnosis not present

## 2020-03-13 DIAGNOSIS — H5440 Blindness, one eye, unspecified eye: Secondary | ICD-10-CM | POA: Diagnosis not present

## 2020-03-13 DIAGNOSIS — G4700 Insomnia, unspecified: Secondary | ICD-10-CM | POA: Diagnosis not present

## 2020-03-28 DIAGNOSIS — C44722 Squamous cell carcinoma of skin of right lower limb, including hip: Secondary | ICD-10-CM | POA: Diagnosis not present

## 2020-03-28 DIAGNOSIS — D225 Melanocytic nevi of trunk: Secondary | ICD-10-CM | POA: Diagnosis not present

## 2020-03-28 DIAGNOSIS — Z1283 Encounter for screening for malignant neoplasm of skin: Secondary | ICD-10-CM | POA: Diagnosis not present

## 2020-03-28 DIAGNOSIS — L570 Actinic keratosis: Secondary | ICD-10-CM | POA: Diagnosis not present

## 2020-03-28 DIAGNOSIS — X32XXXA Exposure to sunlight, initial encounter: Secondary | ICD-10-CM | POA: Diagnosis not present

## 2020-04-02 DIAGNOSIS — K429 Umbilical hernia without obstruction or gangrene: Secondary | ICD-10-CM | POA: Diagnosis not present

## 2020-04-02 DIAGNOSIS — K4090 Unilateral inguinal hernia, without obstruction or gangrene, not specified as recurrent: Secondary | ICD-10-CM | POA: Diagnosis not present

## 2020-04-03 DIAGNOSIS — T675XXA Heat exhaustion, unspecified, initial encounter: Secondary | ICD-10-CM | POA: Diagnosis not present

## 2020-04-03 DIAGNOSIS — Z882 Allergy status to sulfonamides status: Secondary | ICD-10-CM | POA: Diagnosis not present

## 2020-04-03 DIAGNOSIS — R42 Dizziness and giddiness: Secondary | ICD-10-CM | POA: Diagnosis not present

## 2020-04-12 DIAGNOSIS — Z299 Encounter for prophylactic measures, unspecified: Secondary | ICD-10-CM | POA: Diagnosis not present

## 2020-04-12 DIAGNOSIS — J069 Acute upper respiratory infection, unspecified: Secondary | ICD-10-CM | POA: Diagnosis not present

## 2020-04-12 DIAGNOSIS — I10 Essential (primary) hypertension: Secondary | ICD-10-CM | POA: Diagnosis not present

## 2020-04-12 DIAGNOSIS — Z789 Other specified health status: Secondary | ICD-10-CM | POA: Diagnosis not present

## 2020-04-12 DIAGNOSIS — Z6832 Body mass index (BMI) 32.0-32.9, adult: Secondary | ICD-10-CM | POA: Diagnosis not present

## 2020-04-12 DIAGNOSIS — N529 Male erectile dysfunction, unspecified: Secondary | ICD-10-CM | POA: Diagnosis not present

## 2020-04-26 DIAGNOSIS — I10 Essential (primary) hypertension: Secondary | ICD-10-CM | POA: Diagnosis not present

## 2020-04-26 DIAGNOSIS — Z299 Encounter for prophylactic measures, unspecified: Secondary | ICD-10-CM | POA: Diagnosis not present

## 2020-04-26 DIAGNOSIS — M545 Low back pain: Secondary | ICD-10-CM | POA: Diagnosis not present

## 2020-05-15 DIAGNOSIS — M7918 Myalgia, other site: Secondary | ICD-10-CM | POA: Diagnosis not present

## 2020-05-15 DIAGNOSIS — G8929 Other chronic pain: Secondary | ICD-10-CM | POA: Diagnosis not present

## 2020-05-15 DIAGNOSIS — Z5181 Encounter for therapeutic drug level monitoring: Secondary | ICD-10-CM | POA: Diagnosis not present

## 2020-05-15 DIAGNOSIS — M5136 Other intervertebral disc degeneration, lumbar region: Secondary | ICD-10-CM | POA: Diagnosis not present

## 2020-05-15 DIAGNOSIS — Z79891 Long term (current) use of opiate analgesic: Secondary | ICD-10-CM | POA: Diagnosis not present

## 2020-05-15 DIAGNOSIS — M47816 Spondylosis without myelopathy or radiculopathy, lumbar region: Secondary | ICD-10-CM | POA: Diagnosis not present

## 2020-05-15 DIAGNOSIS — M1611 Unilateral primary osteoarthritis, right hip: Secondary | ICD-10-CM | POA: Diagnosis not present

## 2020-05-15 DIAGNOSIS — M170 Bilateral primary osteoarthritis of knee: Secondary | ICD-10-CM | POA: Diagnosis not present

## 2020-05-16 DIAGNOSIS — Z79891 Long term (current) use of opiate analgesic: Secondary | ICD-10-CM | POA: Diagnosis not present

## 2020-05-16 DIAGNOSIS — Z5181 Encounter for therapeutic drug level monitoring: Secondary | ICD-10-CM | POA: Diagnosis not present

## 2020-05-29 DIAGNOSIS — Z08 Encounter for follow-up examination after completed treatment for malignant neoplasm: Secondary | ICD-10-CM | POA: Diagnosis not present

## 2020-05-29 DIAGNOSIS — L928 Other granulomatous disorders of the skin and subcutaneous tissue: Secondary | ICD-10-CM | POA: Diagnosis not present

## 2020-05-29 DIAGNOSIS — Z85828 Personal history of other malignant neoplasm of skin: Secondary | ICD-10-CM | POA: Diagnosis not present

## 2020-05-31 DIAGNOSIS — M25559 Pain in unspecified hip: Secondary | ICD-10-CM | POA: Diagnosis not present

## 2020-05-31 DIAGNOSIS — I10 Essential (primary) hypertension: Secondary | ICD-10-CM | POA: Diagnosis not present

## 2020-05-31 DIAGNOSIS — Z299 Encounter for prophylactic measures, unspecified: Secondary | ICD-10-CM | POA: Diagnosis not present

## 2020-05-31 DIAGNOSIS — C44722 Squamous cell carcinoma of skin of right lower limb, including hip: Secondary | ICD-10-CM | POA: Diagnosis not present

## 2020-05-31 DIAGNOSIS — L03115 Cellulitis of right lower limb: Secondary | ICD-10-CM | POA: Diagnosis not present

## 2020-06-14 DIAGNOSIS — D692 Other nonthrombocytopenic purpura: Secondary | ICD-10-CM | POA: Diagnosis not present

## 2020-06-14 DIAGNOSIS — I10 Essential (primary) hypertension: Secondary | ICD-10-CM | POA: Diagnosis not present

## 2020-06-14 DIAGNOSIS — Z6831 Body mass index (BMI) 31.0-31.9, adult: Secondary | ICD-10-CM | POA: Diagnosis not present

## 2020-06-14 DIAGNOSIS — L0390 Cellulitis, unspecified: Secondary | ICD-10-CM | POA: Diagnosis not present

## 2020-06-14 DIAGNOSIS — M549 Dorsalgia, unspecified: Secondary | ICD-10-CM | POA: Diagnosis not present

## 2020-06-14 DIAGNOSIS — Z299 Encounter for prophylactic measures, unspecified: Secondary | ICD-10-CM | POA: Diagnosis not present

## 2020-06-25 DIAGNOSIS — H5440 Blindness, one eye, unspecified eye: Secondary | ICD-10-CM | POA: Diagnosis not present

## 2020-06-25 DIAGNOSIS — I10 Essential (primary) hypertension: Secondary | ICD-10-CM | POA: Diagnosis not present

## 2020-06-25 DIAGNOSIS — Z299 Encounter for prophylactic measures, unspecified: Secondary | ICD-10-CM | POA: Diagnosis not present

## 2020-06-25 DIAGNOSIS — E7800 Pure hypercholesterolemia, unspecified: Secondary | ICD-10-CM | POA: Diagnosis not present

## 2020-06-25 DIAGNOSIS — G4700 Insomnia, unspecified: Secondary | ICD-10-CM | POA: Diagnosis not present

## 2020-07-29 DIAGNOSIS — Z87891 Personal history of nicotine dependence: Secondary | ICD-10-CM | POA: Diagnosis not present

## 2020-07-29 DIAGNOSIS — Z79899 Other long term (current) drug therapy: Secondary | ICD-10-CM | POA: Diagnosis not present

## 2020-07-29 DIAGNOSIS — Z882 Allergy status to sulfonamides status: Secondary | ICD-10-CM | POA: Diagnosis not present

## 2020-07-29 DIAGNOSIS — Z9114 Patient's other noncompliance with medication regimen: Secondary | ICD-10-CM | POA: Diagnosis not present

## 2020-07-29 DIAGNOSIS — I10 Essential (primary) hypertension: Secondary | ICD-10-CM | POA: Diagnosis not present

## 2020-08-15 DIAGNOSIS — M170 Bilateral primary osteoarthritis of knee: Secondary | ICD-10-CM | POA: Diagnosis not present

## 2020-08-15 DIAGNOSIS — M1611 Unilateral primary osteoarthritis, right hip: Secondary | ICD-10-CM | POA: Diagnosis not present

## 2020-08-15 DIAGNOSIS — Z96643 Presence of artificial hip joint, bilateral: Secondary | ICD-10-CM | POA: Diagnosis not present

## 2020-08-15 DIAGNOSIS — Z5181 Encounter for therapeutic drug level monitoring: Secondary | ICD-10-CM | POA: Diagnosis not present

## 2020-08-15 DIAGNOSIS — Z79891 Long term (current) use of opiate analgesic: Secondary | ICD-10-CM | POA: Diagnosis not present

## 2020-08-15 DIAGNOSIS — M5136 Other intervertebral disc degeneration, lumbar region: Secondary | ICD-10-CM | POA: Diagnosis not present

## 2020-08-15 DIAGNOSIS — Z471 Aftercare following joint replacement surgery: Secondary | ICD-10-CM | POA: Diagnosis not present

## 2020-08-15 DIAGNOSIS — M47816 Spondylosis without myelopathy or radiculopathy, lumbar region: Secondary | ICD-10-CM | POA: Diagnosis not present

## 2020-08-19 DIAGNOSIS — E7800 Pure hypercholesterolemia, unspecified: Secondary | ICD-10-CM | POA: Diagnosis not present

## 2020-08-19 DIAGNOSIS — Z299 Encounter for prophylactic measures, unspecified: Secondary | ICD-10-CM | POA: Diagnosis not present

## 2020-08-19 DIAGNOSIS — Z713 Dietary counseling and surveillance: Secondary | ICD-10-CM | POA: Diagnosis not present

## 2020-08-19 DIAGNOSIS — Z6831 Body mass index (BMI) 31.0-31.9, adult: Secondary | ICD-10-CM | POA: Diagnosis not present

## 2020-08-19 DIAGNOSIS — J069 Acute upper respiratory infection, unspecified: Secondary | ICD-10-CM | POA: Diagnosis not present

## 2020-09-11 DIAGNOSIS — J069 Acute upper respiratory infection, unspecified: Secondary | ICD-10-CM | POA: Diagnosis not present

## 2020-09-11 DIAGNOSIS — Z6831 Body mass index (BMI) 31.0-31.9, adult: Secondary | ICD-10-CM | POA: Diagnosis not present

## 2020-09-11 DIAGNOSIS — Z713 Dietary counseling and surveillance: Secondary | ICD-10-CM | POA: Diagnosis not present

## 2020-09-11 DIAGNOSIS — E7800 Pure hypercholesterolemia, unspecified: Secondary | ICD-10-CM | POA: Diagnosis not present

## 2020-09-11 DIAGNOSIS — Z299 Encounter for prophylactic measures, unspecified: Secondary | ICD-10-CM | POA: Diagnosis not present

## 2020-09-26 DIAGNOSIS — Z713 Dietary counseling and surveillance: Secondary | ICD-10-CM | POA: Diagnosis not present

## 2020-09-26 DIAGNOSIS — I10 Essential (primary) hypertension: Secondary | ICD-10-CM | POA: Diagnosis not present

## 2020-09-26 DIAGNOSIS — J069 Acute upper respiratory infection, unspecified: Secondary | ICD-10-CM | POA: Diagnosis not present

## 2020-09-26 DIAGNOSIS — E1165 Type 2 diabetes mellitus with hyperglycemia: Secondary | ICD-10-CM | POA: Diagnosis not present

## 2020-09-26 DIAGNOSIS — E119 Type 2 diabetes mellitus without complications: Secondary | ICD-10-CM | POA: Diagnosis not present

## 2020-09-26 DIAGNOSIS — Z299 Encounter for prophylactic measures, unspecified: Secondary | ICD-10-CM | POA: Diagnosis not present

## 2020-11-14 DIAGNOSIS — Z299 Encounter for prophylactic measures, unspecified: Secondary | ICD-10-CM | POA: Diagnosis not present

## 2020-11-14 DIAGNOSIS — J029 Acute pharyngitis, unspecified: Secondary | ICD-10-CM | POA: Diagnosis not present

## 2020-11-14 DIAGNOSIS — I10 Essential (primary) hypertension: Secondary | ICD-10-CM | POA: Diagnosis not present

## 2020-11-14 DIAGNOSIS — Z789 Other specified health status: Secondary | ICD-10-CM | POA: Diagnosis not present

## 2020-12-02 DIAGNOSIS — I10 Essential (primary) hypertension: Secondary | ICD-10-CM | POA: Diagnosis not present

## 2020-12-02 DIAGNOSIS — Z713 Dietary counseling and surveillance: Secondary | ICD-10-CM | POA: Diagnosis not present

## 2020-12-02 DIAGNOSIS — E1165 Type 2 diabetes mellitus with hyperglycemia: Secondary | ICD-10-CM | POA: Diagnosis not present

## 2020-12-02 DIAGNOSIS — E119 Type 2 diabetes mellitus without complications: Secondary | ICD-10-CM | POA: Diagnosis not present

## 2020-12-02 DIAGNOSIS — W57XXXA Bitten or stung by nonvenomous insect and other nonvenomous arthropods, initial encounter: Secondary | ICD-10-CM | POA: Diagnosis not present

## 2020-12-02 DIAGNOSIS — Z299 Encounter for prophylactic measures, unspecified: Secondary | ICD-10-CM | POA: Diagnosis not present

## 2020-12-17 DIAGNOSIS — Z5181 Encounter for therapeutic drug level monitoring: Secondary | ICD-10-CM | POA: Diagnosis not present

## 2020-12-17 DIAGNOSIS — G894 Chronic pain syndrome: Secondary | ICD-10-CM | POA: Diagnosis not present

## 2020-12-17 DIAGNOSIS — M170 Bilateral primary osteoarthritis of knee: Secondary | ICD-10-CM | POA: Diagnosis not present

## 2020-12-17 DIAGNOSIS — M1611 Unilateral primary osteoarthritis, right hip: Secondary | ICD-10-CM | POA: Diagnosis not present

## 2020-12-17 DIAGNOSIS — Z96643 Presence of artificial hip joint, bilateral: Secondary | ICD-10-CM | POA: Diagnosis not present

## 2020-12-17 DIAGNOSIS — M5136 Other intervertebral disc degeneration, lumbar region: Secondary | ICD-10-CM | POA: Diagnosis not present

## 2020-12-17 DIAGNOSIS — Z79891 Long term (current) use of opiate analgesic: Secondary | ICD-10-CM | POA: Diagnosis not present

## 2020-12-17 DIAGNOSIS — M47816 Spondylosis without myelopathy or radiculopathy, lumbar region: Secondary | ICD-10-CM | POA: Diagnosis not present

## 2020-12-17 DIAGNOSIS — M7918 Myalgia, other site: Secondary | ICD-10-CM | POA: Diagnosis not present

## 2020-12-17 DIAGNOSIS — G8929 Other chronic pain: Secondary | ICD-10-CM | POA: Diagnosis not present

## 2020-12-27 DIAGNOSIS — I10 Essential (primary) hypertension: Secondary | ICD-10-CM | POA: Diagnosis not present

## 2020-12-27 DIAGNOSIS — Z7189 Other specified counseling: Secondary | ICD-10-CM | POA: Diagnosis not present

## 2020-12-27 DIAGNOSIS — Z1339 Encounter for screening examination for other mental health and behavioral disorders: Secondary | ICD-10-CM | POA: Diagnosis not present

## 2020-12-27 DIAGNOSIS — E1165 Type 2 diabetes mellitus with hyperglycemia: Secondary | ICD-10-CM | POA: Diagnosis not present

## 2020-12-27 DIAGNOSIS — R5383 Other fatigue: Secondary | ICD-10-CM | POA: Diagnosis not present

## 2020-12-27 DIAGNOSIS — Z6833 Body mass index (BMI) 33.0-33.9, adult: Secondary | ICD-10-CM | POA: Diagnosis not present

## 2020-12-27 DIAGNOSIS — E669 Obesity, unspecified: Secondary | ICD-10-CM | POA: Diagnosis not present

## 2020-12-27 DIAGNOSIS — Z1331 Encounter for screening for depression: Secondary | ICD-10-CM | POA: Diagnosis not present

## 2020-12-27 DIAGNOSIS — Z0000 Encounter for general adult medical examination without abnormal findings: Secondary | ICD-10-CM | POA: Diagnosis not present

## 2020-12-31 DIAGNOSIS — N281 Cyst of kidney, acquired: Secondary | ICD-10-CM | POA: Diagnosis not present

## 2020-12-31 DIAGNOSIS — I10 Essential (primary) hypertension: Secondary | ICD-10-CM | POA: Diagnosis not present

## 2020-12-31 DIAGNOSIS — K5730 Diverticulosis of large intestine without perforation or abscess without bleeding: Secondary | ICD-10-CM | POA: Diagnosis not present

## 2020-12-31 DIAGNOSIS — K4090 Unilateral inguinal hernia, without obstruction or gangrene, not specified as recurrent: Secondary | ICD-10-CM | POA: Diagnosis not present

## 2020-12-31 DIAGNOSIS — M25551 Pain in right hip: Secondary | ICD-10-CM | POA: Diagnosis not present

## 2020-12-31 DIAGNOSIS — G8929 Other chronic pain: Secondary | ICD-10-CM | POA: Diagnosis not present

## 2020-12-31 DIAGNOSIS — K449 Diaphragmatic hernia without obstruction or gangrene: Secondary | ICD-10-CM | POA: Diagnosis not present

## 2020-12-31 DIAGNOSIS — K429 Umbilical hernia without obstruction or gangrene: Secondary | ICD-10-CM | POA: Diagnosis not present

## 2020-12-31 DIAGNOSIS — I160 Hypertensive urgency: Secondary | ICD-10-CM | POA: Diagnosis not present

## 2020-12-31 DIAGNOSIS — R109 Unspecified abdominal pain: Secondary | ICD-10-CM | POA: Diagnosis not present

## 2020-12-31 DIAGNOSIS — M25552 Pain in left hip: Secondary | ICD-10-CM | POA: Diagnosis not present

## 2021-01-01 DIAGNOSIS — Z299 Encounter for prophylactic measures, unspecified: Secondary | ICD-10-CM | POA: Diagnosis not present

## 2021-01-01 DIAGNOSIS — M62830 Muscle spasm of back: Secondary | ICD-10-CM | POA: Diagnosis not present

## 2021-01-01 DIAGNOSIS — Q619 Cystic kidney disease, unspecified: Secondary | ICD-10-CM | POA: Diagnosis not present

## 2021-01-01 DIAGNOSIS — M25559 Pain in unspecified hip: Secondary | ICD-10-CM | POA: Diagnosis not present

## 2021-01-01 DIAGNOSIS — I10 Essential (primary) hypertension: Secondary | ICD-10-CM | POA: Diagnosis not present

## 2021-01-01 DIAGNOSIS — D692 Other nonthrombocytopenic purpura: Secondary | ICD-10-CM | POA: Diagnosis not present

## 2021-01-16 DIAGNOSIS — Z299 Encounter for prophylactic measures, unspecified: Secondary | ICD-10-CM | POA: Diagnosis not present

## 2021-01-16 DIAGNOSIS — Z6834 Body mass index (BMI) 34.0-34.9, adult: Secondary | ICD-10-CM | POA: Diagnosis not present

## 2021-01-16 DIAGNOSIS — K649 Unspecified hemorrhoids: Secondary | ICD-10-CM | POA: Diagnosis not present

## 2021-01-16 DIAGNOSIS — E1165 Type 2 diabetes mellitus with hyperglycemia: Secondary | ICD-10-CM | POA: Diagnosis not present

## 2021-01-16 DIAGNOSIS — I10 Essential (primary) hypertension: Secondary | ICD-10-CM | POA: Diagnosis not present

## 2021-01-16 DIAGNOSIS — K625 Hemorrhage of anus and rectum: Secondary | ICD-10-CM | POA: Diagnosis not present

## 2021-02-07 DIAGNOSIS — Z125 Encounter for screening for malignant neoplasm of prostate: Secondary | ICD-10-CM | POA: Diagnosis not present

## 2021-02-07 DIAGNOSIS — Z79899 Other long term (current) drug therapy: Secondary | ICD-10-CM | POA: Diagnosis not present

## 2021-02-07 DIAGNOSIS — R5383 Other fatigue: Secondary | ICD-10-CM | POA: Diagnosis not present

## 2021-02-07 DIAGNOSIS — E7800 Pure hypercholesterolemia, unspecified: Secondary | ICD-10-CM | POA: Diagnosis not present

## 2021-02-10 ENCOUNTER — Encounter: Payer: Self-pay | Admitting: Internal Medicine

## 2021-03-18 ENCOUNTER — Ambulatory Visit: Payer: Self-pay | Admitting: Gastroenterology

## 2021-03-18 ENCOUNTER — Encounter: Payer: Self-pay | Admitting: Internal Medicine

## 2021-03-19 DIAGNOSIS — G894 Chronic pain syndrome: Secondary | ICD-10-CM | POA: Diagnosis not present

## 2021-03-19 DIAGNOSIS — M5136 Other intervertebral disc degeneration, lumbar region: Secondary | ICD-10-CM | POA: Diagnosis not present

## 2021-03-19 DIAGNOSIS — M160 Bilateral primary osteoarthritis of hip: Secondary | ICD-10-CM | POA: Diagnosis not present

## 2021-03-19 DIAGNOSIS — M47816 Spondylosis without myelopathy or radiculopathy, lumbar region: Secondary | ICD-10-CM | POA: Diagnosis not present

## 2021-03-19 DIAGNOSIS — Z96643 Presence of artificial hip joint, bilateral: Secondary | ICD-10-CM | POA: Diagnosis not present

## 2021-04-02 DIAGNOSIS — I10 Essential (primary) hypertension: Secondary | ICD-10-CM | POA: Diagnosis not present

## 2021-04-02 DIAGNOSIS — Z299 Encounter for prophylactic measures, unspecified: Secondary | ICD-10-CM | POA: Diagnosis not present

## 2021-04-02 DIAGNOSIS — H2512 Age-related nuclear cataract, left eye: Secondary | ICD-10-CM | POA: Diagnosis not present

## 2021-04-02 DIAGNOSIS — K5900 Constipation, unspecified: Secondary | ICD-10-CM | POA: Diagnosis not present

## 2021-04-02 DIAGNOSIS — H26131 Total traumatic cataract, right eye: Secondary | ICD-10-CM | POA: Diagnosis not present

## 2021-04-02 DIAGNOSIS — M2550 Pain in unspecified joint: Secondary | ICD-10-CM | POA: Diagnosis not present

## 2021-04-02 DIAGNOSIS — H353121 Nonexudative age-related macular degeneration, left eye, early dry stage: Secondary | ICD-10-CM | POA: Diagnosis not present

## 2021-04-02 DIAGNOSIS — H5202 Hypermetropia, left eye: Secondary | ICD-10-CM | POA: Diagnosis not present

## 2021-04-02 DIAGNOSIS — Z6832 Body mass index (BMI) 32.0-32.9, adult: Secondary | ICD-10-CM | POA: Diagnosis not present

## 2021-04-02 DIAGNOSIS — E1165 Type 2 diabetes mellitus with hyperglycemia: Secondary | ICD-10-CM | POA: Diagnosis not present

## 2021-05-09 DIAGNOSIS — I10 Essential (primary) hypertension: Secondary | ICD-10-CM | POA: Diagnosis not present

## 2021-05-09 DIAGNOSIS — Z299 Encounter for prophylactic measures, unspecified: Secondary | ICD-10-CM | POA: Diagnosis not present

## 2021-05-09 DIAGNOSIS — M25561 Pain in right knee: Secondary | ICD-10-CM | POA: Diagnosis not present

## 2021-05-26 DIAGNOSIS — J019 Acute sinusitis, unspecified: Secondary | ICD-10-CM | POA: Diagnosis not present

## 2021-05-26 DIAGNOSIS — I82409 Acute embolism and thrombosis of unspecified deep veins of unspecified lower extremity: Secondary | ICD-10-CM | POA: Diagnosis not present

## 2021-05-26 DIAGNOSIS — M25569 Pain in unspecified knee: Secondary | ICD-10-CM | POA: Diagnosis not present

## 2021-05-26 DIAGNOSIS — I10 Essential (primary) hypertension: Secondary | ICD-10-CM | POA: Diagnosis not present

## 2021-05-26 DIAGNOSIS — Z299 Encounter for prophylactic measures, unspecified: Secondary | ICD-10-CM | POA: Diagnosis not present

## 2021-05-27 DIAGNOSIS — M25561 Pain in right knee: Secondary | ICD-10-CM | POA: Diagnosis not present

## 2021-05-29 DIAGNOSIS — Z299 Encounter for prophylactic measures, unspecified: Secondary | ICD-10-CM | POA: Diagnosis not present

## 2021-05-29 DIAGNOSIS — I10 Essential (primary) hypertension: Secondary | ICD-10-CM | POA: Diagnosis not present

## 2021-05-29 DIAGNOSIS — M25561 Pain in right knee: Secondary | ICD-10-CM | POA: Diagnosis not present

## 2021-05-29 DIAGNOSIS — M1711 Unilateral primary osteoarthritis, right knee: Secondary | ICD-10-CM | POA: Diagnosis not present

## 2021-05-29 DIAGNOSIS — Z713 Dietary counseling and surveillance: Secondary | ICD-10-CM | POA: Diagnosis not present

## 2021-06-03 DIAGNOSIS — K5792 Diverticulitis of intestine, part unspecified, without perforation or abscess without bleeding: Secondary | ICD-10-CM | POA: Diagnosis not present

## 2021-06-03 DIAGNOSIS — E1165 Type 2 diabetes mellitus with hyperglycemia: Secondary | ICD-10-CM | POA: Diagnosis not present

## 2021-06-03 DIAGNOSIS — I10 Essential (primary) hypertension: Secondary | ICD-10-CM | POA: Diagnosis not present

## 2021-06-03 DIAGNOSIS — Z299 Encounter for prophylactic measures, unspecified: Secondary | ICD-10-CM | POA: Diagnosis not present

## 2021-06-03 DIAGNOSIS — Z6831 Body mass index (BMI) 31.0-31.9, adult: Secondary | ICD-10-CM | POA: Diagnosis not present

## 2021-06-12 DIAGNOSIS — E7800 Pure hypercholesterolemia, unspecified: Secondary | ICD-10-CM | POA: Diagnosis not present

## 2021-06-16 DIAGNOSIS — E781 Pure hyperglyceridemia: Secondary | ICD-10-CM | POA: Diagnosis not present

## 2021-06-16 DIAGNOSIS — Z6831 Body mass index (BMI) 31.0-31.9, adult: Secondary | ICD-10-CM | POA: Diagnosis not present

## 2021-06-16 DIAGNOSIS — K649 Unspecified hemorrhoids: Secondary | ICD-10-CM | POA: Diagnosis not present

## 2021-06-16 DIAGNOSIS — I10 Essential (primary) hypertension: Secondary | ICD-10-CM | POA: Diagnosis not present

## 2021-06-16 DIAGNOSIS — Z299 Encounter for prophylactic measures, unspecified: Secondary | ICD-10-CM | POA: Diagnosis not present

## 2021-06-19 DIAGNOSIS — M5136 Other intervertebral disc degeneration, lumbar region: Secondary | ICD-10-CM | POA: Diagnosis not present

## 2021-06-19 DIAGNOSIS — G894 Chronic pain syndrome: Secondary | ICD-10-CM | POA: Diagnosis not present

## 2021-06-19 DIAGNOSIS — M160 Bilateral primary osteoarthritis of hip: Secondary | ICD-10-CM | POA: Diagnosis not present

## 2021-06-19 DIAGNOSIS — R197 Diarrhea, unspecified: Secondary | ICD-10-CM | POA: Diagnosis not present

## 2021-06-19 DIAGNOSIS — M47816 Spondylosis without myelopathy or radiculopathy, lumbar region: Secondary | ICD-10-CM | POA: Diagnosis not present

## 2021-06-19 DIAGNOSIS — Z96643 Presence of artificial hip joint, bilateral: Secondary | ICD-10-CM | POA: Diagnosis not present

## 2021-06-25 ENCOUNTER — Encounter (INDEPENDENT_AMBULATORY_CARE_PROVIDER_SITE_OTHER): Payer: Self-pay | Admitting: *Deleted

## 2021-06-26 ENCOUNTER — Ambulatory Visit (INDEPENDENT_AMBULATORY_CARE_PROVIDER_SITE_OTHER): Payer: Medicare HMO | Admitting: Gastroenterology

## 2021-06-26 ENCOUNTER — Encounter (INDEPENDENT_AMBULATORY_CARE_PROVIDER_SITE_OTHER): Payer: Self-pay | Admitting: *Deleted

## 2021-07-04 ENCOUNTER — Ambulatory Visit (INDEPENDENT_AMBULATORY_CARE_PROVIDER_SITE_OTHER): Payer: Medicare HMO | Admitting: Gastroenterology

## 2021-07-10 DIAGNOSIS — E1165 Type 2 diabetes mellitus with hyperglycemia: Secondary | ICD-10-CM | POA: Diagnosis not present

## 2021-07-10 DIAGNOSIS — Z23 Encounter for immunization: Secondary | ICD-10-CM | POA: Diagnosis not present

## 2021-07-10 DIAGNOSIS — Z299 Encounter for prophylactic measures, unspecified: Secondary | ICD-10-CM | POA: Diagnosis not present

## 2021-07-10 DIAGNOSIS — I10 Essential (primary) hypertension: Secondary | ICD-10-CM | POA: Diagnosis not present

## 2021-07-10 DIAGNOSIS — Z713 Dietary counseling and surveillance: Secondary | ICD-10-CM | POA: Diagnosis not present

## 2021-07-10 DIAGNOSIS — K5792 Diverticulitis of intestine, part unspecified, without perforation or abscess without bleeding: Secondary | ICD-10-CM | POA: Diagnosis not present

## 2021-07-21 ENCOUNTER — Other Ambulatory Visit: Payer: Self-pay

## 2021-07-21 ENCOUNTER — Ambulatory Visit (INDEPENDENT_AMBULATORY_CARE_PROVIDER_SITE_OTHER): Payer: Medicare HMO | Admitting: Gastroenterology

## 2021-07-21 ENCOUNTER — Encounter (INDEPENDENT_AMBULATORY_CARE_PROVIDER_SITE_OTHER): Payer: Self-pay | Admitting: Gastroenterology

## 2021-07-21 VITALS — BP 130/80 | Ht 66.0 in | Wt 195.0 lb

## 2021-07-21 DIAGNOSIS — K921 Melena: Secondary | ICD-10-CM | POA: Diagnosis not present

## 2021-07-21 DIAGNOSIS — R197 Diarrhea, unspecified: Secondary | ICD-10-CM | POA: Insufficient documentation

## 2021-07-21 NOTE — Progress Notes (Signed)
Referring Provider: Manuella Palmer Primary Care Physician:  Wesley Blitz, MD  Primary GI: not previously established  Patient Location: Home  Provider Location: home   Reason for Visit: bloody diarrhea   Persons present on the virtual encounter, with roles: Wesley Gerlach, NP  Wesley Palmer, patient   Total time (minutes) spent on medical discussion: 20 minutes   Telehealth visit Note  I connected with Wesley Palmer on 07/21/21 at  8:30 AM EDT by telephone and verified that I am speaking with the correct person using two identifiers.   I discussed the limitations, risks, security and privacy concerns of performing an evaluation and management service by telephone and the availability of in person appointments. I also discussed with the patient that there may be a patient responsible charge related to this service. The patient expressed understanding and agreed to proceed.  Chief Complaint  Patient presents with   Diarrhea    Was having diarrhea with blood about twice a day for 2 weeks. Has cleared up for about one month now. Went 3- 4 days without a BM and now having small amount of stool once a day. No abdominal pain.    History of Present Illness: Wesley Palmer is an 80 year old male with pmh of HTN. He presents today for an acute course of diarrhea with hematochezia.   Patient reports that starting about 1 month ago, for about 3 weeks he was having diarrhea with bright red blood in the toilet, thereafter. He reports that stools were watery starting out, then would be loose like milkshake consistency, with 1-2 BMs per day.He denies abdominal pain, nausea, vomiting, no recent antibiotics or travel, no recent sick contacts. He denies melena or mucus. States that maybe 1/2 tea cup of blood each time he had a BM over the course of the 3 weeks. He reports that symptoms subsided on their own without any intervention. He denies any dizziness, sob or fatigue. He states since then, he had had  minimal BMs, however, Yesterday, he was able to have an almost normal BM. He denies any NSAID use. Denies any history of the same. He has had no unintentional weight loss, issues with his appetite or early satiety.   Social hx: occasional alcohol use Family hx: sister had Crohn's disease, no CRC or liver disease   Last colonoscopy:2 years ago-done at [facility], he thinks it normal.  Last EGD: never  Past Medical History:  Diagnosis Date   HTN (hypertension)     Past Surgical History:  Procedure Laterality Date   COLONOSCOPY     2020 per patient at [facility] in [place] Bilateral     Current Meds  Medication Sig   benazepril (LOTENSIN) 10 MG tablet Take 10 mg by mouth daily.   Multiple Vitamin (MULTIVITAMIN) tablet Take 1 tablet by mouth daily.   [START ON 08/02/2021] oxyCODONE-acetaminophen (PERCOCET) 10-325 MG tablet Take by mouth.     History reviewed. No pertinent family history.  Social History   Socioeconomic History   Marital status: Married    Spouse name: Not on file   Number of children: Not on file   Years of education: Not on file   Highest education level: Not on file  Occupational History   Not on file  Tobacco Use   Smoking status: Former    Types: Cigarettes    Quit date: 1982    Years since quitting: 40.8   Smokeless tobacco: Never  Substance and  Sexual Activity   Alcohol use: Not Currently   Drug use: Never   Sexual activity: Not on file  Other Topics Concern   Not on file  Social History Narrative   Not on file   Social Determinants of Health   Financial Resource Strain: Not on file  Food Insecurity: Not on file  Transportation Needs: Not on file  Physical Activity: Not on file  Stress: Not on file  Social Connections: Not on file    Review of Systems: Gen: Denies fever, chills, anorexia. Denies fatigue, weakness, weight loss.  CV: Denies chest pain, palpitations, syncope, peripheral edema, and  claudication. Resp: Denies dyspnea at rest, cough, wheezing, coughing up blood, and pleurisy. GI: see HPI Derm: Denies rash, itching, dry skin Psych: Denies depression, anxiety, memory loss, confusion. No homicidal or suicidal ideation.  Heme: Denies bruising, bleeding, and enlarged lymph nodes.  Observations/Objective: No distress. Unable to perform physical exam due to telephone encounter. No video available.   Assessment and Plan: 80 year old male with sudden onset of diarrhea with hematochezia x3 weeks without any other associated symptoms. Diarrhea and bleeding resolved on its own. Suspect patient likely had an acute course of gastroentertitis or infectious etiology that has since resolved. Given that diarrhea has subsided, stool studies would not be beneficial at this point. Patient had recent colonoscopy in 2021 that was reportedly normal. We will obtain these records for review. Low suspicion for diverticulitis as patient had no associated pain and given recent colonoscopy, likelihood of malignancy is also very low. Spontaneous resolution of symptoms is reassuring that etiology was likely a benign cause. He will contact me if he begins to have diarrhea, abdominal pain, weight loss or rectal bleeding. I will see him again in 1 month for reevaluation of his symptoms. If diarrhea returns, we will proceed with stool studies and colonoscopy will be needed with recurrent hematochezia.   Patient denies melena, nausea, vomiting, constipation, dysphagia, odyonophagia, early satiety or weight loss.   Follow Up Instructions: Follow up 1 month   I discussed the assessment and treatment plan with the patient. The patient was provided an opportunity to ask questions and all were answered. The patient agreed with the plan and demonstrated an understanding of the instructions.   The patient was advised to call back or seek an in-person evaluation if the symptoms worsen or if the condition fails to  improve as anticipated.  I provided 20 minutes of telephone time with patient.  Wesley Palmer L. Alver Sorrow, MSN, APRN, AGNP-C Adult-Gerontology Nurse Practitioner [facility]

## 2021-07-21 NOTE — Patient Instructions (Signed)
Please continue to monitor your bowel movements, if you begin to have diarrhea, blood in your stools, weight loss, abdominal pain or black stools, please let us know.  It is likely that you had an infection that caused your bloody diarrhea, since it resolved on its own. If you have a recurrence of these symptoms, we will need to proceed with further evaluation with possible stool studies/colonoscopy.  We will plan to see you again in 1 month to make sure that you are still doing well and no new symptoms have occurred.

## 2021-08-08 ENCOUNTER — Encounter (INDEPENDENT_AMBULATORY_CARE_PROVIDER_SITE_OTHER): Payer: Self-pay

## 2021-08-18 DIAGNOSIS — Z0100 Encounter for examination of eyes and vision without abnormal findings: Secondary | ICD-10-CM | POA: Diagnosis not present

## 2021-08-25 DIAGNOSIS — Z713 Dietary counseling and surveillance: Secondary | ICD-10-CM | POA: Diagnosis not present

## 2021-08-25 DIAGNOSIS — J019 Acute sinusitis, unspecified: Secondary | ICD-10-CM | POA: Diagnosis not present

## 2021-08-25 DIAGNOSIS — Z6832 Body mass index (BMI) 32.0-32.9, adult: Secondary | ICD-10-CM | POA: Diagnosis not present

## 2021-08-25 DIAGNOSIS — I10 Essential (primary) hypertension: Secondary | ICD-10-CM | POA: Diagnosis not present

## 2021-08-25 DIAGNOSIS — Z299 Encounter for prophylactic measures, unspecified: Secondary | ICD-10-CM | POA: Diagnosis not present

## 2021-08-28 DIAGNOSIS — R6889 Other general symptoms and signs: Secondary | ICD-10-CM | POA: Diagnosis not present

## 2021-08-28 DIAGNOSIS — I10 Essential (primary) hypertension: Secondary | ICD-10-CM | POA: Diagnosis not present

## 2021-08-28 DIAGNOSIS — Z6832 Body mass index (BMI) 32.0-32.9, adult: Secondary | ICD-10-CM | POA: Diagnosis not present

## 2021-08-28 DIAGNOSIS — Z299 Encounter for prophylactic measures, unspecified: Secondary | ICD-10-CM | POA: Diagnosis not present

## 2021-08-28 DIAGNOSIS — R059 Cough, unspecified: Secondary | ICD-10-CM | POA: Diagnosis not present

## 2021-09-05 DIAGNOSIS — Z299 Encounter for prophylactic measures, unspecified: Secondary | ICD-10-CM | POA: Diagnosis not present

## 2021-09-05 DIAGNOSIS — Z6832 Body mass index (BMI) 32.0-32.9, adult: Secondary | ICD-10-CM | POA: Diagnosis not present

## 2021-09-05 DIAGNOSIS — Z789 Other specified health status: Secondary | ICD-10-CM | POA: Diagnosis not present

## 2021-09-05 DIAGNOSIS — I10 Essential (primary) hypertension: Secondary | ICD-10-CM | POA: Diagnosis not present

## 2021-09-05 DIAGNOSIS — M549 Dorsalgia, unspecified: Secondary | ICD-10-CM | POA: Diagnosis not present

## 2021-09-05 DIAGNOSIS — R6889 Other general symptoms and signs: Secondary | ICD-10-CM | POA: Diagnosis not present

## 2021-09-05 DIAGNOSIS — E1165 Type 2 diabetes mellitus with hyperglycemia: Secondary | ICD-10-CM | POA: Diagnosis not present

## 2021-09-12 DIAGNOSIS — M2550 Pain in unspecified joint: Secondary | ICD-10-CM | POA: Diagnosis not present

## 2021-09-12 DIAGNOSIS — Z6832 Body mass index (BMI) 32.0-32.9, adult: Secondary | ICD-10-CM | POA: Diagnosis not present

## 2021-09-12 DIAGNOSIS — E1165 Type 2 diabetes mellitus with hyperglycemia: Secondary | ICD-10-CM | POA: Diagnosis not present

## 2021-09-12 DIAGNOSIS — Z299 Encounter for prophylactic measures, unspecified: Secondary | ICD-10-CM | POA: Diagnosis not present

## 2021-09-12 DIAGNOSIS — I10 Essential (primary) hypertension: Secondary | ICD-10-CM | POA: Diagnosis not present

## 2021-09-18 DIAGNOSIS — M25562 Pain in left knee: Secondary | ICD-10-CM | POA: Diagnosis not present

## 2021-09-18 DIAGNOSIS — M170 Bilateral primary osteoarthritis of knee: Secondary | ICD-10-CM | POA: Diagnosis not present

## 2021-09-18 DIAGNOSIS — G894 Chronic pain syndrome: Secondary | ICD-10-CM | POA: Diagnosis not present

## 2021-09-18 DIAGNOSIS — M5136 Other intervertebral disc degeneration, lumbar region: Secondary | ICD-10-CM | POA: Diagnosis not present

## 2021-09-18 DIAGNOSIS — Z79891 Long term (current) use of opiate analgesic: Secondary | ICD-10-CM | POA: Diagnosis not present

## 2021-09-18 DIAGNOSIS — G8929 Other chronic pain: Secondary | ICD-10-CM | POA: Diagnosis not present

## 2021-09-18 DIAGNOSIS — M47816 Spondylosis without myelopathy or radiculopathy, lumbar region: Secondary | ICD-10-CM | POA: Diagnosis not present

## 2021-09-18 DIAGNOSIS — M25561 Pain in right knee: Secondary | ICD-10-CM | POA: Diagnosis not present

## 2021-09-18 DIAGNOSIS — Z5181 Encounter for therapeutic drug level monitoring: Secondary | ICD-10-CM | POA: Diagnosis not present

## 2021-10-14 DIAGNOSIS — Z299 Encounter for prophylactic measures, unspecified: Secondary | ICD-10-CM | POA: Diagnosis not present

## 2021-10-14 DIAGNOSIS — Z789 Other specified health status: Secondary | ICD-10-CM | POA: Diagnosis not present

## 2021-10-14 DIAGNOSIS — E1165 Type 2 diabetes mellitus with hyperglycemia: Secondary | ICD-10-CM | POA: Diagnosis not present

## 2021-10-14 DIAGNOSIS — I10 Essential (primary) hypertension: Secondary | ICD-10-CM | POA: Diagnosis not present

## 2021-10-14 DIAGNOSIS — L989 Disorder of the skin and subcutaneous tissue, unspecified: Secondary | ICD-10-CM | POA: Diagnosis not present

## 2021-10-14 DIAGNOSIS — A662 Other early skin lesions of yaws: Secondary | ICD-10-CM | POA: Diagnosis not present

## 2021-10-14 DIAGNOSIS — Z6833 Body mass index (BMI) 33.0-33.9, adult: Secondary | ICD-10-CM | POA: Diagnosis not present

## 2021-10-14 DIAGNOSIS — C44722 Squamous cell carcinoma of skin of right lower limb, including hip: Secondary | ICD-10-CM | POA: Diagnosis not present

## 2021-10-20 DIAGNOSIS — C44519 Basal cell carcinoma of skin of other part of trunk: Secondary | ICD-10-CM | POA: Diagnosis not present

## 2021-10-20 DIAGNOSIS — D485 Neoplasm of uncertain behavior of skin: Secondary | ICD-10-CM | POA: Diagnosis not present

## 2021-10-20 DIAGNOSIS — D225 Melanocytic nevi of trunk: Secondary | ICD-10-CM | POA: Diagnosis not present

## 2021-10-20 DIAGNOSIS — L570 Actinic keratosis: Secondary | ICD-10-CM | POA: Diagnosis not present

## 2021-10-20 DIAGNOSIS — X32XXXD Exposure to sunlight, subsequent encounter: Secondary | ICD-10-CM | POA: Diagnosis not present

## 2021-10-20 DIAGNOSIS — Z1283 Encounter for screening for malignant neoplasm of skin: Secondary | ICD-10-CM | POA: Diagnosis not present

## 2021-10-20 DIAGNOSIS — C44722 Squamous cell carcinoma of skin of right lower limb, including hip: Secondary | ICD-10-CM | POA: Diagnosis not present

## 2021-11-06 ENCOUNTER — Ambulatory Visit (INDEPENDENT_AMBULATORY_CARE_PROVIDER_SITE_OTHER): Payer: Medicare HMO | Admitting: Gastroenterology

## 2021-11-26 DIAGNOSIS — E1165 Type 2 diabetes mellitus with hyperglycemia: Secondary | ICD-10-CM | POA: Diagnosis not present

## 2021-11-26 DIAGNOSIS — I10 Essential (primary) hypertension: Secondary | ICD-10-CM | POA: Diagnosis not present

## 2021-11-26 DIAGNOSIS — Z789 Other specified health status: Secondary | ICD-10-CM | POA: Diagnosis not present

## 2021-11-26 DIAGNOSIS — Z299 Encounter for prophylactic measures, unspecified: Secondary | ICD-10-CM | POA: Diagnosis not present

## 2021-11-26 DIAGNOSIS — U071 COVID-19: Secondary | ICD-10-CM | POA: Diagnosis not present

## 2021-12-18 DIAGNOSIS — M5136 Other intervertebral disc degeneration, lumbar region: Secondary | ICD-10-CM | POA: Diagnosis not present

## 2021-12-18 DIAGNOSIS — M25561 Pain in right knee: Secondary | ICD-10-CM | POA: Diagnosis not present

## 2021-12-18 DIAGNOSIS — M170 Bilateral primary osteoarthritis of knee: Secondary | ICD-10-CM | POA: Diagnosis not present

## 2021-12-18 DIAGNOSIS — M25562 Pain in left knee: Secondary | ICD-10-CM | POA: Diagnosis not present

## 2021-12-18 DIAGNOSIS — M47816 Spondylosis without myelopathy or radiculopathy, lumbar region: Secondary | ICD-10-CM | POA: Diagnosis not present

## 2021-12-18 DIAGNOSIS — G894 Chronic pain syndrome: Secondary | ICD-10-CM | POA: Diagnosis not present

## 2021-12-31 DIAGNOSIS — Z125 Encounter for screening for malignant neoplasm of prostate: Secondary | ICD-10-CM | POA: Diagnosis not present

## 2021-12-31 DIAGNOSIS — I10 Essential (primary) hypertension: Secondary | ICD-10-CM | POA: Diagnosis not present

## 2021-12-31 DIAGNOSIS — R5383 Other fatigue: Secondary | ICD-10-CM | POA: Diagnosis not present

## 2021-12-31 DIAGNOSIS — Z0000 Encounter for general adult medical examination without abnormal findings: Secondary | ICD-10-CM | POA: Diagnosis not present

## 2021-12-31 DIAGNOSIS — Z1331 Encounter for screening for depression: Secondary | ICD-10-CM | POA: Diagnosis not present

## 2021-12-31 DIAGNOSIS — Z7189 Other specified counseling: Secondary | ICD-10-CM | POA: Diagnosis not present

## 2021-12-31 DIAGNOSIS — E7800 Pure hypercholesterolemia, unspecified: Secondary | ICD-10-CM | POA: Diagnosis not present

## 2021-12-31 DIAGNOSIS — Z79899 Other long term (current) drug therapy: Secondary | ICD-10-CM | POA: Diagnosis not present

## 2021-12-31 DIAGNOSIS — Z6832 Body mass index (BMI) 32.0-32.9, adult: Secondary | ICD-10-CM | POA: Diagnosis not present

## 2021-12-31 DIAGNOSIS — Z1339 Encounter for screening examination for other mental health and behavioral disorders: Secondary | ICD-10-CM | POA: Diagnosis not present

## 2021-12-31 DIAGNOSIS — Z299 Encounter for prophylactic measures, unspecified: Secondary | ICD-10-CM | POA: Diagnosis not present

## 2022-01-05 DIAGNOSIS — Z299 Encounter for prophylactic measures, unspecified: Secondary | ICD-10-CM | POA: Diagnosis not present

## 2022-01-05 DIAGNOSIS — Z713 Dietary counseling and surveillance: Secondary | ICD-10-CM | POA: Diagnosis not present

## 2022-01-05 DIAGNOSIS — W57XXXA Bitten or stung by nonvenomous insect and other nonvenomous arthropods, initial encounter: Secondary | ICD-10-CM | POA: Diagnosis not present

## 2022-01-05 DIAGNOSIS — I10 Essential (primary) hypertension: Secondary | ICD-10-CM | POA: Diagnosis not present

## 2022-01-05 DIAGNOSIS — Z6832 Body mass index (BMI) 32.0-32.9, adult: Secondary | ICD-10-CM | POA: Diagnosis not present

## 2022-01-14 DIAGNOSIS — S30860A Insect bite (nonvenomous) of lower back and pelvis, initial encounter: Secondary | ICD-10-CM | POA: Diagnosis not present

## 2022-01-14 DIAGNOSIS — Z85828 Personal history of other malignant neoplasm of skin: Secondary | ICD-10-CM | POA: Diagnosis not present

## 2022-01-14 DIAGNOSIS — Z08 Encounter for follow-up examination after completed treatment for malignant neoplasm: Secondary | ICD-10-CM | POA: Diagnosis not present

## 2022-02-20 DIAGNOSIS — H3341 Traction detachment of retina, right eye: Secondary | ICD-10-CM | POA: Diagnosis not present

## 2022-02-20 DIAGNOSIS — H44511 Absolute glaucoma, right eye: Secondary | ICD-10-CM | POA: Diagnosis not present

## 2022-02-20 DIAGNOSIS — H43812 Vitreous degeneration, left eye: Secondary | ICD-10-CM | POA: Diagnosis not present

## 2022-02-20 DIAGNOSIS — H353221 Exudative age-related macular degeneration, left eye, with active choroidal neovascularization: Secondary | ICD-10-CM | POA: Diagnosis not present

## 2022-02-20 DIAGNOSIS — H35052 Retinal neovascularization, unspecified, left eye: Secondary | ICD-10-CM | POA: Diagnosis not present

## 2022-02-27 DIAGNOSIS — H353221 Exudative age-related macular degeneration, left eye, with active choroidal neovascularization: Secondary | ICD-10-CM | POA: Diagnosis not present

## 2022-03-27 DIAGNOSIS — H3341 Traction detachment of retina, right eye: Secondary | ICD-10-CM | POA: Diagnosis not present

## 2022-03-27 DIAGNOSIS — H35052 Retinal neovascularization, unspecified, left eye: Secondary | ICD-10-CM | POA: Diagnosis not present

## 2022-03-27 DIAGNOSIS — H43812 Vitreous degeneration, left eye: Secondary | ICD-10-CM | POA: Diagnosis not present

## 2022-03-27 DIAGNOSIS — H353221 Exudative age-related macular degeneration, left eye, with active choroidal neovascularization: Secondary | ICD-10-CM | POA: Diagnosis not present

## 2022-04-20 DIAGNOSIS — H5710 Ocular pain, unspecified eye: Secondary | ICD-10-CM | POA: Diagnosis not present

## 2022-04-20 DIAGNOSIS — I10 Essential (primary) hypertension: Secondary | ICD-10-CM | POA: Diagnosis not present

## 2022-04-20 DIAGNOSIS — Z0000 Encounter for general adult medical examination without abnormal findings: Secondary | ICD-10-CM | POA: Diagnosis not present

## 2022-04-20 DIAGNOSIS — Z7189 Other specified counseling: Secondary | ICD-10-CM | POA: Diagnosis not present

## 2022-04-20 DIAGNOSIS — Z6830 Body mass index (BMI) 30.0-30.9, adult: Secondary | ICD-10-CM | POA: Diagnosis not present

## 2022-04-20 DIAGNOSIS — L989 Disorder of the skin and subcutaneous tissue, unspecified: Secondary | ICD-10-CM | POA: Diagnosis not present

## 2022-04-20 DIAGNOSIS — Z299 Encounter for prophylactic measures, unspecified: Secondary | ICD-10-CM | POA: Diagnosis not present

## 2022-04-30 DIAGNOSIS — M47816 Spondylosis without myelopathy or radiculopathy, lumbar region: Secondary | ICD-10-CM | POA: Diagnosis not present

## 2022-04-30 DIAGNOSIS — M5459 Other low back pain: Secondary | ICD-10-CM | POA: Diagnosis not present

## 2022-05-01 DIAGNOSIS — Z299 Encounter for prophylactic measures, unspecified: Secondary | ICD-10-CM | POA: Diagnosis not present

## 2022-05-01 DIAGNOSIS — H109 Unspecified conjunctivitis: Secondary | ICD-10-CM | POA: Diagnosis not present

## 2022-05-01 DIAGNOSIS — Z6830 Body mass index (BMI) 30.0-30.9, adult: Secondary | ICD-10-CM | POA: Diagnosis not present

## 2022-05-01 DIAGNOSIS — I10 Essential (primary) hypertension: Secondary | ICD-10-CM | POA: Diagnosis not present

## 2022-05-01 DIAGNOSIS — E1165 Type 2 diabetes mellitus with hyperglycemia: Secondary | ICD-10-CM | POA: Diagnosis not present

## 2022-05-01 DIAGNOSIS — E7800 Pure hypercholesterolemia, unspecified: Secondary | ICD-10-CM | POA: Diagnosis not present

## 2022-05-04 DIAGNOSIS — C44629 Squamous cell carcinoma of skin of left upper limb, including shoulder: Secondary | ICD-10-CM | POA: Diagnosis not present

## 2022-05-12 DIAGNOSIS — M47816 Spondylosis without myelopathy or radiculopathy, lumbar region: Secondary | ICD-10-CM | POA: Diagnosis not present

## 2022-05-12 DIAGNOSIS — Z5181 Encounter for therapeutic drug level monitoring: Secondary | ICD-10-CM | POA: Diagnosis not present

## 2022-05-12 DIAGNOSIS — M5459 Other low back pain: Secondary | ICD-10-CM | POA: Diagnosis not present

## 2022-05-12 DIAGNOSIS — M25561 Pain in right knee: Secondary | ICD-10-CM | POA: Diagnosis not present

## 2022-05-12 DIAGNOSIS — M5136 Other intervertebral disc degeneration, lumbar region: Secondary | ICD-10-CM | POA: Diagnosis not present

## 2022-05-12 DIAGNOSIS — Z79891 Long term (current) use of opiate analgesic: Secondary | ICD-10-CM | POA: Diagnosis not present

## 2022-05-12 DIAGNOSIS — G894 Chronic pain syndrome: Secondary | ICD-10-CM | POA: Diagnosis not present

## 2022-05-12 DIAGNOSIS — M25562 Pain in left knee: Secondary | ICD-10-CM | POA: Diagnosis not present

## 2022-05-15 DIAGNOSIS — H3341 Traction detachment of retina, right eye: Secondary | ICD-10-CM | POA: Diagnosis not present

## 2022-05-15 DIAGNOSIS — H5316 Psychophysical visual disturbances: Secondary | ICD-10-CM | POA: Diagnosis not present

## 2022-05-15 DIAGNOSIS — H43812 Vitreous degeneration, left eye: Secondary | ICD-10-CM | POA: Diagnosis not present

## 2022-05-15 DIAGNOSIS — H35052 Retinal neovascularization, unspecified, left eye: Secondary | ICD-10-CM | POA: Diagnosis not present

## 2022-05-15 DIAGNOSIS — H44511 Absolute glaucoma, right eye: Secondary | ICD-10-CM | POA: Diagnosis not present

## 2022-05-15 DIAGNOSIS — H353221 Exudative age-related macular degeneration, left eye, with active choroidal neovascularization: Secondary | ICD-10-CM | POA: Diagnosis not present

## 2022-06-08 DIAGNOSIS — Z713 Dietary counseling and surveillance: Secondary | ICD-10-CM | POA: Diagnosis not present

## 2022-06-08 DIAGNOSIS — Z6830 Body mass index (BMI) 30.0-30.9, adult: Secondary | ICD-10-CM | POA: Diagnosis not present

## 2022-06-08 DIAGNOSIS — K649 Unspecified hemorrhoids: Secondary | ICD-10-CM | POA: Diagnosis not present

## 2022-06-08 DIAGNOSIS — Z299 Encounter for prophylactic measures, unspecified: Secondary | ICD-10-CM | POA: Diagnosis not present

## 2022-06-08 DIAGNOSIS — I10 Essential (primary) hypertension: Secondary | ICD-10-CM | POA: Diagnosis not present

## 2022-06-22 DIAGNOSIS — L089 Local infection of the skin and subcutaneous tissue, unspecified: Secondary | ICD-10-CM | POA: Diagnosis not present

## 2022-06-22 DIAGNOSIS — Z299 Encounter for prophylactic measures, unspecified: Secondary | ICD-10-CM | POA: Diagnosis not present

## 2022-06-22 DIAGNOSIS — I10 Essential (primary) hypertension: Secondary | ICD-10-CM | POA: Diagnosis not present

## 2022-07-27 DIAGNOSIS — H3341 Traction detachment of retina, right eye: Secondary | ICD-10-CM | POA: Diagnosis not present

## 2022-07-27 DIAGNOSIS — H35052 Retinal neovascularization, unspecified, left eye: Secondary | ICD-10-CM | POA: Diagnosis not present

## 2022-07-27 DIAGNOSIS — H43812 Vitreous degeneration, left eye: Secondary | ICD-10-CM | POA: Diagnosis not present

## 2022-07-27 DIAGNOSIS — H44511 Absolute glaucoma, right eye: Secondary | ICD-10-CM | POA: Diagnosis not present

## 2022-07-27 DIAGNOSIS — H5316 Psychophysical visual disturbances: Secondary | ICD-10-CM | POA: Diagnosis not present

## 2022-07-27 DIAGNOSIS — H353221 Exudative age-related macular degeneration, left eye, with active choroidal neovascularization: Secondary | ICD-10-CM | POA: Diagnosis not present

## 2022-07-28 DIAGNOSIS — K219 Gastro-esophageal reflux disease without esophagitis: Secondary | ICD-10-CM | POA: Diagnosis not present

## 2022-07-28 DIAGNOSIS — I10 Essential (primary) hypertension: Secondary | ICD-10-CM | POA: Diagnosis not present

## 2022-08-04 DIAGNOSIS — E7800 Pure hypercholesterolemia, unspecified: Secondary | ICD-10-CM | POA: Diagnosis not present

## 2022-08-04 DIAGNOSIS — R7301 Impaired fasting glucose: Secondary | ICD-10-CM | POA: Diagnosis not present

## 2022-08-04 DIAGNOSIS — R3589 Other polyuria: Secondary | ICD-10-CM | POA: Diagnosis not present

## 2022-08-31 DIAGNOSIS — I10 Essential (primary) hypertension: Secondary | ICD-10-CM | POA: Diagnosis not present

## 2022-08-31 DIAGNOSIS — Z299 Encounter for prophylactic measures, unspecified: Secondary | ICD-10-CM | POA: Diagnosis not present

## 2022-08-31 DIAGNOSIS — M545 Low back pain, unspecified: Secondary | ICD-10-CM | POA: Diagnosis not present

## 2022-09-15 DIAGNOSIS — E1165 Type 2 diabetes mellitus with hyperglycemia: Secondary | ICD-10-CM | POA: Diagnosis not present

## 2022-09-15 DIAGNOSIS — Z6830 Body mass index (BMI) 30.0-30.9, adult: Secondary | ICD-10-CM | POA: Diagnosis not present

## 2022-09-15 DIAGNOSIS — I10 Essential (primary) hypertension: Secondary | ICD-10-CM | POA: Diagnosis not present

## 2022-09-15 DIAGNOSIS — Z713 Dietary counseling and surveillance: Secondary | ICD-10-CM | POA: Diagnosis not present

## 2022-09-15 DIAGNOSIS — Z299 Encounter for prophylactic measures, unspecified: Secondary | ICD-10-CM | POA: Diagnosis not present

## 2022-09-23 DIAGNOSIS — M47816 Spondylosis without myelopathy or radiculopathy, lumbar region: Secondary | ICD-10-CM | POA: Diagnosis not present

## 2022-09-23 DIAGNOSIS — G894 Chronic pain syndrome: Secondary | ICD-10-CM | POA: Diagnosis not present

## 2022-09-23 DIAGNOSIS — M5136 Other intervertebral disc degeneration, lumbar region: Secondary | ICD-10-CM | POA: Diagnosis not present

## 2022-09-23 DIAGNOSIS — M5459 Other low back pain: Secondary | ICD-10-CM | POA: Diagnosis not present

## 2022-09-25 DIAGNOSIS — R059 Cough, unspecified: Secondary | ICD-10-CM | POA: Diagnosis not present

## 2022-09-25 DIAGNOSIS — Z299 Encounter for prophylactic measures, unspecified: Secondary | ICD-10-CM | POA: Diagnosis not present

## 2022-09-25 DIAGNOSIS — R0981 Nasal congestion: Secondary | ICD-10-CM | POA: Diagnosis not present

## 2022-09-25 DIAGNOSIS — J069 Acute upper respiratory infection, unspecified: Secondary | ICD-10-CM | POA: Diagnosis not present

## 2022-11-05 DIAGNOSIS — R3912 Poor urinary stream: Secondary | ICD-10-CM | POA: Diagnosis not present

## 2022-11-05 DIAGNOSIS — E559 Vitamin D deficiency, unspecified: Secondary | ICD-10-CM | POA: Diagnosis not present

## 2022-11-05 DIAGNOSIS — R7301 Impaired fasting glucose: Secondary | ICD-10-CM | POA: Diagnosis not present

## 2022-11-05 DIAGNOSIS — R5383 Other fatigue: Secondary | ICD-10-CM | POA: Diagnosis not present

## 2022-11-05 DIAGNOSIS — E7800 Pure hypercholesterolemia, unspecified: Secondary | ICD-10-CM | POA: Diagnosis not present

## 2022-11-05 DIAGNOSIS — E538 Deficiency of other specified B group vitamins: Secondary | ICD-10-CM | POA: Diagnosis not present

## 2022-11-06 DIAGNOSIS — H5316 Psychophysical visual disturbances: Secondary | ICD-10-CM | POA: Diagnosis not present

## 2022-11-06 DIAGNOSIS — H353221 Exudative age-related macular degeneration, left eye, with active choroidal neovascularization: Secondary | ICD-10-CM | POA: Diagnosis not present

## 2022-11-06 DIAGNOSIS — H43812 Vitreous degeneration, left eye: Secondary | ICD-10-CM | POA: Diagnosis not present

## 2022-11-06 DIAGNOSIS — H35052 Retinal neovascularization, unspecified, left eye: Secondary | ICD-10-CM | POA: Diagnosis not present

## 2022-11-06 DIAGNOSIS — H3341 Traction detachment of retina, right eye: Secondary | ICD-10-CM | POA: Diagnosis not present

## 2022-11-06 DIAGNOSIS — H44511 Absolute glaucoma, right eye: Secondary | ICD-10-CM | POA: Diagnosis not present

## 2022-11-30 DIAGNOSIS — H353221 Exudative age-related macular degeneration, left eye, with active choroidal neovascularization: Secondary | ICD-10-CM | POA: Diagnosis not present

## 2022-12-01 DIAGNOSIS — C44729 Squamous cell carcinoma of skin of left lower limb, including hip: Secondary | ICD-10-CM | POA: Diagnosis not present

## 2022-12-18 DIAGNOSIS — E1165 Type 2 diabetes mellitus with hyperglycemia: Secondary | ICD-10-CM | POA: Diagnosis not present

## 2022-12-18 DIAGNOSIS — M545 Low back pain, unspecified: Secondary | ICD-10-CM | POA: Diagnosis not present

## 2022-12-18 DIAGNOSIS — E781 Pure hyperglyceridemia: Secondary | ICD-10-CM | POA: Diagnosis not present

## 2022-12-18 DIAGNOSIS — I10 Essential (primary) hypertension: Secondary | ICD-10-CM | POA: Diagnosis not present

## 2022-12-18 DIAGNOSIS — Z299 Encounter for prophylactic measures, unspecified: Secondary | ICD-10-CM | POA: Diagnosis not present

## 2022-12-18 DIAGNOSIS — Z6830 Body mass index (BMI) 30.0-30.9, adult: Secondary | ICD-10-CM | POA: Diagnosis not present

## 2022-12-23 DIAGNOSIS — M5136 Other intervertebral disc degeneration, lumbar region: Secondary | ICD-10-CM | POA: Diagnosis not present

## 2022-12-23 DIAGNOSIS — M5459 Other low back pain: Secondary | ICD-10-CM | POA: Diagnosis not present

## 2022-12-23 DIAGNOSIS — M25561 Pain in right knee: Secondary | ICD-10-CM | POA: Diagnosis not present

## 2022-12-23 DIAGNOSIS — M47816 Spondylosis without myelopathy or radiculopathy, lumbar region: Secondary | ICD-10-CM | POA: Diagnosis not present

## 2022-12-23 DIAGNOSIS — G8929 Other chronic pain: Secondary | ICD-10-CM | POA: Diagnosis not present

## 2022-12-23 DIAGNOSIS — M170 Bilateral primary osteoarthritis of knee: Secondary | ICD-10-CM | POA: Diagnosis not present

## 2022-12-23 DIAGNOSIS — G894 Chronic pain syndrome: Secondary | ICD-10-CM | POA: Diagnosis not present

## 2022-12-23 DIAGNOSIS — M25562 Pain in left knee: Secondary | ICD-10-CM | POA: Diagnosis not present

## 2023-01-04 DIAGNOSIS — Z1331 Encounter for screening for depression: Secondary | ICD-10-CM | POA: Diagnosis not present

## 2023-01-04 DIAGNOSIS — Z0000 Encounter for general adult medical examination without abnormal findings: Secondary | ICD-10-CM | POA: Diagnosis not present

## 2023-01-04 DIAGNOSIS — Z79899 Other long term (current) drug therapy: Secondary | ICD-10-CM | POA: Diagnosis not present

## 2023-01-04 DIAGNOSIS — Z6831 Body mass index (BMI) 31.0-31.9, adult: Secondary | ICD-10-CM | POA: Diagnosis not present

## 2023-01-04 DIAGNOSIS — Z299 Encounter for prophylactic measures, unspecified: Secondary | ICD-10-CM | POA: Diagnosis not present

## 2023-01-04 DIAGNOSIS — Z7189 Other specified counseling: Secondary | ICD-10-CM | POA: Diagnosis not present

## 2023-01-04 DIAGNOSIS — R5383 Other fatigue: Secondary | ICD-10-CM | POA: Diagnosis not present

## 2023-01-04 DIAGNOSIS — E7800 Pure hypercholesterolemia, unspecified: Secondary | ICD-10-CM | POA: Diagnosis not present

## 2023-01-04 DIAGNOSIS — Z125 Encounter for screening for malignant neoplasm of prostate: Secondary | ICD-10-CM | POA: Diagnosis not present

## 2023-01-04 DIAGNOSIS — Z1339 Encounter for screening examination for other mental health and behavioral disorders: Secondary | ICD-10-CM | POA: Diagnosis not present

## 2023-02-02 DIAGNOSIS — H3341 Traction detachment of retina, right eye: Secondary | ICD-10-CM | POA: Diagnosis not present

## 2023-02-02 DIAGNOSIS — H353221 Exudative age-related macular degeneration, left eye, with active choroidal neovascularization: Secondary | ICD-10-CM | POA: Diagnosis not present

## 2023-02-02 DIAGNOSIS — H35052 Retinal neovascularization, unspecified, left eye: Secondary | ICD-10-CM | POA: Diagnosis not present

## 2023-02-02 DIAGNOSIS — H43812 Vitreous degeneration, left eye: Secondary | ICD-10-CM | POA: Diagnosis not present

## 2023-02-09 DIAGNOSIS — E1165 Type 2 diabetes mellitus with hyperglycemia: Secondary | ICD-10-CM | POA: Diagnosis not present

## 2023-02-09 DIAGNOSIS — K5900 Constipation, unspecified: Secondary | ICD-10-CM | POA: Diagnosis not present

## 2023-02-09 DIAGNOSIS — J029 Acute pharyngitis, unspecified: Secondary | ICD-10-CM | POA: Diagnosis not present

## 2023-02-09 DIAGNOSIS — Z299 Encounter for prophylactic measures, unspecified: Secondary | ICD-10-CM | POA: Diagnosis not present

## 2023-02-09 DIAGNOSIS — E7800 Pure hypercholesterolemia, unspecified: Secondary | ICD-10-CM | POA: Diagnosis not present

## 2023-02-09 DIAGNOSIS — I10 Essential (primary) hypertension: Secondary | ICD-10-CM | POA: Diagnosis not present

## 2023-03-09 DIAGNOSIS — M25559 Pain in unspecified hip: Secondary | ICD-10-CM | POA: Diagnosis not present

## 2023-03-09 DIAGNOSIS — J302 Other seasonal allergic rhinitis: Secondary | ICD-10-CM | POA: Diagnosis not present

## 2023-03-09 DIAGNOSIS — Z299 Encounter for prophylactic measures, unspecified: Secondary | ICD-10-CM | POA: Diagnosis not present

## 2023-03-09 DIAGNOSIS — I10 Essential (primary) hypertension: Secondary | ICD-10-CM | POA: Diagnosis not present

## 2023-03-09 DIAGNOSIS — K5900 Constipation, unspecified: Secondary | ICD-10-CM | POA: Diagnosis not present

## 2023-03-09 DIAGNOSIS — M1710 Unilateral primary osteoarthritis, unspecified knee: Secondary | ICD-10-CM | POA: Diagnosis not present

## 2023-03-29 DIAGNOSIS — R238 Other skin changes: Secondary | ICD-10-CM | POA: Diagnosis not present

## 2023-03-29 DIAGNOSIS — M25561 Pain in right knee: Secondary | ICD-10-CM | POA: Diagnosis not present

## 2023-03-29 DIAGNOSIS — H3530 Unspecified macular degeneration: Secondary | ICD-10-CM | POA: Diagnosis not present

## 2023-03-29 DIAGNOSIS — G894 Chronic pain syndrome: Secondary | ICD-10-CM | POA: Diagnosis not present

## 2023-03-29 DIAGNOSIS — M5459 Other low back pain: Secondary | ICD-10-CM | POA: Diagnosis not present

## 2023-03-29 DIAGNOSIS — M5136 Other intervertebral disc degeneration, lumbar region: Secondary | ICD-10-CM | POA: Diagnosis not present

## 2023-03-29 DIAGNOSIS — M47816 Spondylosis without myelopathy or radiculopathy, lumbar region: Secondary | ICD-10-CM | POA: Diagnosis not present

## 2023-03-29 DIAGNOSIS — G8929 Other chronic pain: Secondary | ICD-10-CM | POA: Diagnosis not present

## 2023-03-29 DIAGNOSIS — M25562 Pain in left knee: Secondary | ICD-10-CM | POA: Diagnosis not present

## 2023-03-29 DIAGNOSIS — M170 Bilateral primary osteoarthritis of knee: Secondary | ICD-10-CM | POA: Diagnosis not present

## 2023-04-13 DIAGNOSIS — Z299 Encounter for prophylactic measures, unspecified: Secondary | ICD-10-CM | POA: Diagnosis not present

## 2023-04-13 DIAGNOSIS — J069 Acute upper respiratory infection, unspecified: Secondary | ICD-10-CM | POA: Diagnosis not present

## 2023-04-13 DIAGNOSIS — M1710 Unilateral primary osteoarthritis, unspecified knee: Secondary | ICD-10-CM | POA: Diagnosis not present

## 2023-04-13 DIAGNOSIS — R5383 Other fatigue: Secondary | ICD-10-CM | POA: Diagnosis not present

## 2023-04-28 DIAGNOSIS — Z299 Encounter for prophylactic measures, unspecified: Secondary | ICD-10-CM | POA: Diagnosis not present

## 2023-04-28 DIAGNOSIS — R109 Unspecified abdominal pain: Secondary | ICD-10-CM | POA: Diagnosis not present

## 2023-04-28 DIAGNOSIS — I10 Essential (primary) hypertension: Secondary | ICD-10-CM | POA: Diagnosis not present

## 2023-05-18 DIAGNOSIS — H353221 Exudative age-related macular degeneration, left eye, with active choroidal neovascularization: Secondary | ICD-10-CM | POA: Diagnosis not present

## 2023-06-07 DIAGNOSIS — E1165 Type 2 diabetes mellitus with hyperglycemia: Secondary | ICD-10-CM | POA: Diagnosis not present

## 2023-06-07 DIAGNOSIS — L739 Follicular disorder, unspecified: Secondary | ICD-10-CM | POA: Diagnosis not present

## 2023-06-07 DIAGNOSIS — I10 Essential (primary) hypertension: Secondary | ICD-10-CM | POA: Diagnosis not present

## 2023-06-07 DIAGNOSIS — Z299 Encounter for prophylactic measures, unspecified: Secondary | ICD-10-CM | POA: Diagnosis not present

## 2023-07-01 DIAGNOSIS — L910 Hypertrophic scar: Secondary | ICD-10-CM | POA: Diagnosis not present

## 2023-07-01 DIAGNOSIS — L570 Actinic keratosis: Secondary | ICD-10-CM | POA: Diagnosis not present

## 2023-07-01 DIAGNOSIS — X32XXXD Exposure to sunlight, subsequent encounter: Secondary | ICD-10-CM | POA: Diagnosis not present

## 2023-07-19 DIAGNOSIS — Z299 Encounter for prophylactic measures, unspecified: Secondary | ICD-10-CM | POA: Diagnosis not present

## 2023-07-19 DIAGNOSIS — Z23 Encounter for immunization: Secondary | ICD-10-CM | POA: Diagnosis not present

## 2023-07-19 DIAGNOSIS — E119 Type 2 diabetes mellitus without complications: Secondary | ICD-10-CM | POA: Diagnosis not present

## 2023-07-19 DIAGNOSIS — E1165 Type 2 diabetes mellitus with hyperglycemia: Secondary | ICD-10-CM | POA: Diagnosis not present

## 2023-08-11 DIAGNOSIS — Z299 Encounter for prophylactic measures, unspecified: Secondary | ICD-10-CM | POA: Diagnosis not present

## 2023-08-11 DIAGNOSIS — E1169 Type 2 diabetes mellitus with other specified complication: Secondary | ICD-10-CM | POA: Diagnosis not present

## 2023-08-11 DIAGNOSIS — I10 Essential (primary) hypertension: Secondary | ICD-10-CM | POA: Diagnosis not present

## 2023-09-16 DIAGNOSIS — H353221 Exudative age-related macular degeneration, left eye, with active choroidal neovascularization: Secondary | ICD-10-CM | POA: Diagnosis not present

## 2024-05-23 DIAGNOSIS — I10 Essential (primary) hypertension: Secondary | ICD-10-CM | POA: Diagnosis not present

## 2024-05-23 DIAGNOSIS — Z7189 Other specified counseling: Secondary | ICD-10-CM | POA: Diagnosis not present

## 2024-05-23 DIAGNOSIS — R5383 Other fatigue: Secondary | ICD-10-CM | POA: Diagnosis not present

## 2024-05-23 DIAGNOSIS — E7800 Pure hypercholesterolemia, unspecified: Secondary | ICD-10-CM | POA: Diagnosis not present

## 2024-05-23 DIAGNOSIS — Z1339 Encounter for screening examination for other mental health and behavioral disorders: Secondary | ICD-10-CM | POA: Diagnosis not present

## 2024-05-23 DIAGNOSIS — Z299 Encounter for prophylactic measures, unspecified: Secondary | ICD-10-CM | POA: Diagnosis not present

## 2024-05-23 DIAGNOSIS — Z0000 Encounter for general adult medical examination without abnormal findings: Secondary | ICD-10-CM | POA: Diagnosis not present

## 2024-05-23 DIAGNOSIS — Z1331 Encounter for screening for depression: Secondary | ICD-10-CM | POA: Diagnosis not present

## 2024-05-23 DIAGNOSIS — E119 Type 2 diabetes mellitus without complications: Secondary | ICD-10-CM | POA: Diagnosis not present

## 2024-05-25 DIAGNOSIS — H353221 Exudative age-related macular degeneration, left eye, with active choroidal neovascularization: Secondary | ICD-10-CM | POA: Diagnosis not present

## 2024-06-07 DIAGNOSIS — Z85828 Personal history of other malignant neoplasm of skin: Secondary | ICD-10-CM | POA: Diagnosis not present

## 2024-06-07 DIAGNOSIS — Z08 Encounter for follow-up examination after completed treatment for malignant neoplasm: Secondary | ICD-10-CM | POA: Diagnosis not present

## 2024-06-07 DIAGNOSIS — L578 Other skin changes due to chronic exposure to nonionizing radiation: Secondary | ICD-10-CM | POA: Diagnosis not present

## 2024-06-07 DIAGNOSIS — L570 Actinic keratosis: Secondary | ICD-10-CM | POA: Diagnosis not present

## 2024-06-26 DIAGNOSIS — H1032 Unspecified acute conjunctivitis, left eye: Secondary | ICD-10-CM | POA: Diagnosis not present

## 2024-07-06 DIAGNOSIS — E785 Hyperlipidemia, unspecified: Secondary | ICD-10-CM | POA: Diagnosis not present

## 2024-07-06 DIAGNOSIS — Z91148 Patient's other noncompliance with medication regimen for other reason: Secondary | ICD-10-CM | POA: Diagnosis not present

## 2024-07-06 DIAGNOSIS — R030 Elevated blood-pressure reading, without diagnosis of hypertension: Secondary | ICD-10-CM | POA: Diagnosis not present

## 2024-07-06 DIAGNOSIS — R55 Syncope and collapse: Secondary | ICD-10-CM | POA: Diagnosis not present

## 2024-07-06 DIAGNOSIS — T50906A Underdosing of unspecified drugs, medicaments and biological substances, initial encounter: Secondary | ICD-10-CM | POA: Diagnosis not present

## 2024-07-06 DIAGNOSIS — R42 Dizziness and giddiness: Secondary | ICD-10-CM | POA: Diagnosis not present
